# Patient Record
Sex: Female | Born: 1960 | Race: White | Hispanic: No | Marital: Single | Smoking: Current every day smoker
Health system: Southern US, Community
[De-identification: ages and names within clinical notes are randomized; demographics above are authoritative.]

## PROBLEM LIST (undated history)

## (undated) DIAGNOSIS — E039 Hypothyroidism, unspecified: Secondary | ICD-10-CM

## (undated) DIAGNOSIS — F4310 Post-traumatic stress disorder, unspecified: Secondary | ICD-10-CM

## (undated) DIAGNOSIS — C801 Malignant (primary) neoplasm, unspecified: Secondary | ICD-10-CM

## (undated) DIAGNOSIS — M5136 Other intervertebral disc degeneration, lumbar region: Secondary | ICD-10-CM

## (undated) DIAGNOSIS — J449 Chronic obstructive pulmonary disease, unspecified: Secondary | ICD-10-CM

## (undated) DIAGNOSIS — K2270 Barrett's esophagus without dysplasia: Secondary | ICD-10-CM

## (undated) DIAGNOSIS — M51369 Other intervertebral disc degeneration, lumbar region without mention of lumbar back pain or lower extremity pain: Secondary | ICD-10-CM

## (undated) DIAGNOSIS — M1990 Unspecified osteoarthritis, unspecified site: Secondary | ICD-10-CM

## (undated) DIAGNOSIS — G629 Polyneuropathy, unspecified: Secondary | ICD-10-CM

## (undated) DIAGNOSIS — M419 Scoliosis, unspecified: Secondary | ICD-10-CM

## (undated) DIAGNOSIS — S83519A Sprain of anterior cruciate ligament of unspecified knee, initial encounter: Secondary | ICD-10-CM

## (undated) DIAGNOSIS — K635 Polyp of colon: Secondary | ICD-10-CM

## (undated) DIAGNOSIS — B1920 Unspecified viral hepatitis C without hepatic coma: Secondary | ICD-10-CM

## (undated) DIAGNOSIS — R87619 Unspecified abnormal cytological findings in specimens from cervix uteri: Secondary | ICD-10-CM

## (undated) DIAGNOSIS — E079 Disorder of thyroid, unspecified: Secondary | ICD-10-CM

## (undated) DIAGNOSIS — K219 Gastro-esophageal reflux disease without esophagitis: Secondary | ICD-10-CM

## (undated) DIAGNOSIS — F419 Anxiety disorder, unspecified: Secondary | ICD-10-CM

## (undated) DIAGNOSIS — I10 Essential (primary) hypertension: Secondary | ICD-10-CM

## (undated) HISTORY — PX: CERVICAL BIOPSY  W/ LOOP ELECTRODE EXCISION: SUR135

## (undated) HISTORY — PX: TONSILLECTOMY: SUR1361

## (undated) HISTORY — DX: Polyp of colon: K63.5

## (undated) HISTORY — PX: CERVICAL CONE BIOPSY: SUR198

## (undated) HISTORY — DX: Barrett's esophagus without dysplasia: K22.70

## (undated) HISTORY — PX: CERVIX LESION DESTRUCTION: SHX591

## (undated) HISTORY — PX: TUBAL LIGATION: SHX77

## (undated) HISTORY — DX: Polyneuropathy, unspecified: G62.9

## (undated) HISTORY — PX: THYROIDECTOMY, PARTIAL: SHX18

## (undated) HISTORY — PX: NECK SURGERY: SHX720

## (undated) HISTORY — PX: DILATION AND CURETTAGE OF UTERUS: SHX78

## (undated) HISTORY — DX: Unspecified abnormal cytological findings in specimens from cervix uteri: R87.619

---

## 1986-06-25 DIAGNOSIS — C801 Malignant (primary) neoplasm, unspecified: Secondary | ICD-10-CM

## 1986-06-25 HISTORY — DX: Malignant (primary) neoplasm, unspecified: C80.1

## 2000-09-30 ENCOUNTER — Emergency Department (HOSPITAL_COMMUNITY): Admission: EM | Admit: 2000-09-30 | Discharge: 2000-10-01 | Payer: Self-pay | Admitting: Internal Medicine

## 2000-10-04 ENCOUNTER — Emergency Department (HOSPITAL_COMMUNITY): Admission: EM | Admit: 2000-10-04 | Discharge: 2000-10-04 | Payer: Self-pay | Admitting: Emergency Medicine

## 2000-10-06 ENCOUNTER — Emergency Department (HOSPITAL_COMMUNITY): Admission: EM | Admit: 2000-10-06 | Discharge: 2000-10-07 | Payer: Self-pay | Admitting: Emergency Medicine

## 2000-11-07 ENCOUNTER — Encounter: Payer: Self-pay | Admitting: Emergency Medicine

## 2000-11-07 ENCOUNTER — Emergency Department (HOSPITAL_COMMUNITY): Admission: EM | Admit: 2000-11-07 | Discharge: 2000-11-08 | Payer: Self-pay | Admitting: Emergency Medicine

## 2001-02-25 ENCOUNTER — Emergency Department (HOSPITAL_COMMUNITY): Admission: EM | Admit: 2001-02-25 | Discharge: 2001-02-25 | Payer: Self-pay

## 2001-02-28 ENCOUNTER — Emergency Department (HOSPITAL_COMMUNITY): Admission: EM | Admit: 2001-02-28 | Discharge: 2001-02-28 | Payer: Self-pay | Admitting: *Deleted

## 2001-04-04 ENCOUNTER — Encounter: Admission: RE | Admit: 2001-04-04 | Discharge: 2001-07-03 | Payer: Self-pay | Admitting: Anesthesiology

## 2004-07-07 ENCOUNTER — Emergency Department: Payer: Self-pay | Admitting: General Practice

## 2004-07-13 ENCOUNTER — Emergency Department: Payer: Self-pay | Admitting: Internal Medicine

## 2004-10-25 ENCOUNTER — Emergency Department: Payer: Self-pay | Admitting: Emergency Medicine

## 2005-07-04 ENCOUNTER — Emergency Department: Payer: Self-pay | Admitting: Emergency Medicine

## 2006-01-20 ENCOUNTER — Emergency Department: Payer: Self-pay | Admitting: Unknown Physician Specialty

## 2006-01-29 ENCOUNTER — Ambulatory Visit: Payer: Self-pay | Admitting: Otolaryngology

## 2007-04-24 ENCOUNTER — Emergency Department: Payer: Self-pay

## 2007-07-24 ENCOUNTER — Ambulatory Visit: Payer: Self-pay | Admitting: Family Medicine

## 2007-12-19 ENCOUNTER — Emergency Department: Payer: Self-pay | Admitting: Emergency Medicine

## 2008-06-19 ENCOUNTER — Emergency Department: Payer: Self-pay | Admitting: Emergency Medicine

## 2008-10-21 ENCOUNTER — Ambulatory Visit: Payer: Self-pay | Admitting: Family Medicine

## 2008-10-27 ENCOUNTER — Ambulatory Visit: Payer: Self-pay | Admitting: Family Medicine

## 2009-04-18 ENCOUNTER — Emergency Department: Payer: Self-pay | Admitting: Emergency Medicine

## 2009-05-22 ENCOUNTER — Emergency Department: Payer: Self-pay | Admitting: Internal Medicine

## 2010-03-13 ENCOUNTER — Emergency Department: Payer: Self-pay | Admitting: Emergency Medicine

## 2010-05-08 ENCOUNTER — Ambulatory Visit: Payer: Self-pay | Admitting: Family Medicine

## 2010-05-19 ENCOUNTER — Emergency Department: Payer: Self-pay | Admitting: Emergency Medicine

## 2010-05-20 ENCOUNTER — Emergency Department: Payer: Self-pay | Admitting: Unknown Physician Specialty

## 2010-05-21 ENCOUNTER — Inpatient Hospital Stay: Payer: Self-pay | Admitting: Specialist

## 2010-06-19 ENCOUNTER — Emergency Department: Payer: Self-pay | Admitting: Emergency Medicine

## 2010-10-02 ENCOUNTER — Emergency Department: Payer: Self-pay | Admitting: Emergency Medicine

## 2010-10-27 ENCOUNTER — Ambulatory Visit: Payer: Self-pay | Admitting: Family Medicine

## 2011-01-14 ENCOUNTER — Emergency Department: Payer: Self-pay | Admitting: Unknown Physician Specialty

## 2011-06-05 ENCOUNTER — Emergency Department: Payer: Self-pay | Admitting: *Deleted

## 2011-06-08 ENCOUNTER — Emergency Department: Payer: Self-pay | Admitting: Emergency Medicine

## 2012-04-19 ENCOUNTER — Emergency Department: Payer: Self-pay | Admitting: Emergency Medicine

## 2013-05-11 ENCOUNTER — Ambulatory Visit: Payer: Self-pay

## 2013-05-19 ENCOUNTER — Ambulatory Visit: Payer: Self-pay

## 2013-11-17 ENCOUNTER — Ambulatory Visit: Payer: Self-pay

## 2014-05-12 ENCOUNTER — Ambulatory Visit: Payer: Self-pay

## 2015-08-27 ENCOUNTER — Emergency Department: Payer: Self-pay

## 2015-08-27 ENCOUNTER — Emergency Department
Admission: EM | Admit: 2015-08-27 | Discharge: 2015-08-27 | Disposition: A | Discharge status: Home / Self Care | Payer: Self-pay | Attending: Student | Admitting: Student

## 2015-08-27 DIAGNOSIS — F17200 Nicotine dependence, unspecified, uncomplicated: Secondary | ICD-10-CM | POA: Insufficient documentation

## 2015-08-27 DIAGNOSIS — Z20818 Contact with and (suspected) exposure to other bacterial communicable diseases: Secondary | ICD-10-CM | POA: Insufficient documentation

## 2015-08-27 DIAGNOSIS — R1030 Lower abdominal pain, unspecified: Secondary | ICD-10-CM | POA: Insufficient documentation

## 2015-08-27 DIAGNOSIS — J069 Acute upper respiratory infection, unspecified: Secondary | ICD-10-CM | POA: Insufficient documentation

## 2015-08-27 DIAGNOSIS — R110 Nausea: Secondary | ICD-10-CM | POA: Insufficient documentation

## 2015-08-27 HISTORY — DX: Disorder of thyroid, unspecified: E07.9

## 2015-08-27 HISTORY — DX: Unspecified osteoarthritis, unspecified site: M19.90

## 2015-08-27 HISTORY — DX: Scoliosis, unspecified: M41.9

## 2015-08-27 LAB — URINALYSIS COMPLETE WITH MICROSCOPIC (ARMC ONLY)
BILIRUBIN URINE: NEGATIVE
Bacteria, UA: NONE SEEN
GLUCOSE, UA: NEGATIVE mg/dL
Ketones, ur: NEGATIVE mg/dL
Leukocytes, UA: NEGATIVE
Nitrite: NEGATIVE
Protein, ur: NEGATIVE mg/dL
SPECIFIC GRAVITY, URINE: 1.002 — AB (ref 1.005–1.030)
pH: 6 (ref 5.0–8.0)

## 2015-08-27 LAB — BASIC METABOLIC PANEL
Anion gap: 5 (ref 5–15)
BUN: 7 mg/dL (ref 6–20)
CALCIUM: 8.6 mg/dL — AB (ref 8.9–10.3)
CHLORIDE: 108 mmol/L (ref 101–111)
CO2: 27 mmol/L (ref 22–32)
CREATININE: 0.78 mg/dL (ref 0.44–1.00)
GFR calc Af Amer: >60 mL/min (ref >60)
GFR calc non Af Amer: >60 mL/min (ref >60)
GLUCOSE: 82 mg/dL (ref 65–99)
Potassium: 4.1 mmol/L (ref 3.5–5.1)
Sodium: 140 mmol/L (ref 135–145)

## 2015-08-27 LAB — CBC WITH DIFFERENTIAL/PLATELET
BASOS PCT: 1 %
Basophils Absolute: 0 10*3/uL (ref 0–0.1)
EOS ABS: 0.1 10*3/uL (ref 0–0.7)
EOS PCT: 2 %
HCT: 40.3 % (ref 35.0–47.0)
HEMOGLOBIN: 13.9 g/dL (ref 12.0–16.0)
LYMPHS ABS: 1.4 10*3/uL (ref 1.0–3.6)
Lymphocytes Relative: 34 %
MCH: 30.5 pg (ref 26.0–34.0)
MCHC: 34.4 g/dL (ref 32.0–36.0)
MCV: 88.7 fL (ref 80.0–100.0)
MONO ABS: 0.8 10*3/uL (ref 0.2–0.9)
MONOS PCT: 19 %
NEUTROS PCT: 44 %
Neutro Abs: 1.8 10*3/uL (ref 1.4–6.5)
Platelets: 209 10*3/uL (ref 150–440)
RBC: 4.54 MIL/uL (ref 3.80–5.20)
RDW: 13.7 % (ref 11.5–14.5)
WBC: 4.1 10*3/uL (ref 3.6–11.0)

## 2015-08-27 LAB — RAPID INFLUENZA A&B ANTIGENS (ARMC ONLY)
INFLUENZA A (ARMC): NEGATIVE
INFLUENZA B (ARMC): NEGATIVE

## 2015-08-27 LAB — POCT RAPID STREP A: Streptococcus, Group A Screen (Direct): NEGATIVE

## 2015-08-27 MED ORDER — ACETAMINOPHEN 325 MG PO TABS
ORAL_TABLET | ORAL | Status: AC
  Filled 2015-08-27: qty 2

## 2015-08-27 MED ORDER — AZITHROMYCIN 250 MG PO TABS: ORAL_TABLET | ORAL | Status: DC

## 2015-08-27 MED ORDER — IBUPROFEN 800 MG PO TABS: 800.0000 mg | ORAL_TABLET | Freq: Three times a day (TID) | ORAL | Status: DC | PRN

## 2015-08-27 MED ORDER — GUAIFENESIN-CODEINE 100-10 MG/5ML PO SOLN: 10.0000 mL | ORAL | Status: DC | PRN

## 2015-08-27 MED ORDER — ACETAMINOPHEN 500 MG PO TABS
1000.0000 mg | ORAL_TABLET | Freq: Once | ORAL | Status: AC
  Administered 2015-08-27: 1000 mg via ORAL
  Filled 2015-08-27: qty 2

## 2015-08-27 MED ORDER — SODIUM CHLORIDE 0.9 % IV BOLUS (SEPSIS)
1000.0000 mL | Freq: Once | INTRAVENOUS | Status: AC
  Administered 2015-08-27: 1000 mL via INTRAVENOUS

## 2015-08-27 MED ORDER — CHLORPHENIRAMINE MALEATE 4 MG PO TABS: 4.0000 mg | ORAL_TABLET | Freq: Two times a day (BID) | ORAL | Status: DC | PRN

## 2015-08-27 NOTE — ED Provider Notes (Signed)
[facility] Emergency Department Provider Note  ____________________________________________  Time seen: Approximately 12:05 PM  I have reviewed the triage vital signs and the nursing notes.   HISTORY  Chief Complaint Generalized Body Aches and Cough    HPI Brittney Chavez is a 56 y.o. female presents with a 2 to three-day history of fever chills body aches cough and abdominal pain nausea and dysuria dehydrated. Patient states that her appetite and desire to drink is been decreased. Feels thirsty. Able to keep fluids down without vomiting.Marland Kitchen Positive for headache nonradiating.Her daughter-in-law's in the next room with positive strep pharyngitis.   Past Medical History  Diagnosis Date  . Thyroid disease   . Scoliosis   . Arthritis     There are no active problems to display for this patient.   No past surgical history on file.  Current Outpatient Rx  Name  Route  Sig  Dispense  Refill  . azithromycin (ZITHROMAX Z-PAK) 250 MG tablet      Take 2 tablets (500 mg) on  Day 1,  followed by 1 tablet (250 mg) once daily on Days 2 through 5.   6 each   0   . chlorpheniramine (CHLOR-TRIMETON) 4 MG tablet   Oral   Take 1 tablet (4 mg total) by mouth 2 (two) times daily as needed for allergies or rhinitis.   30 tablet   0   . guaiFENesin-codeine 100-10 MG/5ML syrup   Oral   Take 10 mLs by mouth every 4 (four) hours as needed for cough.   180 mL   0   . ibuprofen (ADVIL,MOTRIN) 800 MG tablet   Oral   Take 1 tablet (800 mg total) by mouth every 8 (eight) hours as needed.   30 tablet   0     Allergies Review of patient's allergies indicates no known allergies.  No family history on file.  Social History Social History  Substance Use Topics  . Smoking status: Current Every Day Smoker  . Smokeless tobacco: None  . Alcohol Use: No    Review of Systems Constitutional: As a fever/chills Eyes: No visual changes. ENT: Positive sore  throat. Cardiovascular: Denies chest pain. Respiratory: Denies shortness of breath. Positive for cough Gastrointestinal: Positive lower abdominal pain.  Positive nausea, no vomiting.  No diarrhea.  No constipation. Genitourinary: Positive for dysuria with associated foul-smelling urine. Musculoskeletal: Positive for generalized body aches. Skin: Negative for rash. Neurological: Positive for headaches, negative for focal weakness or numbness.  10-point ROS otherwise negative.  ____________________________________________   PHYSICAL EXAM:  VITAL SIGNS: ED Triage Vitals  Enc Vitals Group     BP 08/27/15 1138 132/91 mmHg     Pulse Rate 08/27/15 1138 87     Resp 08/27/15 1138 18     Temp 08/27/15 1138 97.9 F (36.6 C)     Temp Source 08/27/15 1138 Oral     SpO2 08/27/15 1138 95 %     Weight 08/27/15 1138 195 lb (88.451 kg)     Height 08/27/15 1138 5\' 5"  (1.651 m)     Head Cir --      Peak Flow --      Pain Score 08/27/15 1138 9     Pain Loc --      Pain Edu? --      Excl. in [place]? --     Constitutional: Alert and oriented. Well appearing and in no acute distress. Eyes: Conjunctivae are normal. PERRL. EOMI. Head: Atraumatic. Nose:  Positive nasal congestion/rhinnorhea. Mouth/Throat: Mucous membranes are moist.  Oropharynx mildly erythematous. Neck: No stridor.  Full range of motion nontender Cardiovascular: Normal rate, regular rhythm. Grossly normal heart sounds.  Good peripheral circulation. Respiratory: Normal respiratory effort.  No retractions. Lungs with poor inspiration and decreased breath sounds bilaterally. Gastrointestinal: Soft and nontender. No distention. No abdominal bruits. No CVA tenderness. Musculoskeletal: No lower extremity tenderness nor edema.  No joint effusions. Neurologic:  Normal speech and language. No gross focal neurologic deficits are appreciated. No gait instability. Skin:  Skin is warm, dry and intact. No rash noted. Psychiatric: Mood and affect  are normal. Speech and behavior are normal.  ____________________________________________   LABS (all labs ordered are listed, but only abnormal results are displayed)  Labs Reviewed  BASIC METABOLIC PANEL - Abnormal; Notable for the following:    Calcium 8.6 (*)    All other components within normal limits  URINALYSIS COMPLETEWITH MICROSCOPIC ([facility] ONLY) - Abnormal; Notable for the following:    Color, Urine STRAW (*)    APPearance CLEAR (*)    Specific Gravity, Urine 1.002 (*)    Hgb urine dipstick 1+ (*)    Squamous Epithelial / LPF 0-5 (*)    All other components within normal limits  RAPID INFLUENZA A&B ANTIGENS ([facility] ONLY)  CBC WITH DIFFERENTIAL/PLATELET  POCT RAPID STREP A   ____________________________________________  RADIOLOGY  Negative for any acute cardiopulmonary disorders. ____________________________________________   PROCEDURES  Procedure(s) performed: None  Critical Care performed: No  ____________________________________________   INITIAL IMPRESSION / ASSESSMENT AND PLAN / ED COURSE  Pertinent labs & imaging results that were available during my care of the patient were reviewed by me and considered in my medical decision making (see chart for details).  Acute pharyngitis with strep exposure. Acute upper respiratory infection. Rx given for Zithromax, chlorpheniramine, Robitussin-AC, Motrin 800 mg. Patient follow-up with PCP or return to the ER with any worsening symptomology. He discontinued catheter tach patient feels better than she did upon arrival. ____________________________________________   FINAL CLINICAL IMPRESSION(S) / ED DIAGNOSES  Final diagnoses:  URI, acute  Strep throat exposure     This chart was dictated using voice recognition software/Dragon. Despite best efforts to proofread, errors can occur which can change the meaning. Any change was purely unintentional.   Arlyss Repress, PA-C 08/27/15 1405  Joanne Gavel,  MD 08/27/15 1606

## 2015-08-27 NOTE — ED Notes (Signed)
Discussed discharge instructions, prescriptions, and follow-up care with patient. No questions or concerns at this time. Pt stable at discharge.  

## 2015-08-27 NOTE — ED Notes (Signed)
Pt arrives to ER via POV c/o generalized body aches, cough, strong smelling urine. Pt alert and oriented X4, active, cooperative, pt in NAD. RR even and unlabored, color WNL.

## 2015-08-27 NOTE — Discharge Instructions (Signed)
Upper Respiratory Infection, Adult Most upper respiratory infections (URIs) are a viral infection of the air passages leading to the lungs. A URI affects the nose, throat, and upper air passages. The most common type of URI is nasopharyngitis and is typically referred to as "the common cold." URIs run their course and usually go away on their own. Most of the time, a URI does not require medical attention, but sometimes a bacterial infection in the upper airways can follow a viral infection. This is called a secondary infection. Sinus and middle ear infections are common types of secondary upper respiratory infections. Bacterial pneumonia can also complicate a URI. A URI can worsen asthma and chronic obstructive pulmonary disease (COPD). Sometimes, these complications can require emergency medical care and may be life threatening.  CAUSES Almost all URIs are caused by viruses. A virus is a type of germ and can spread from one person to another.  RISKS FACTORS You may be at risk for a URI if:   You smoke.   You have chronic heart or lung disease.  You have a weakened defense (immune) system.   You are very young or very old.   You have nasal allergies or asthma.  You work in crowded or poorly ventilated areas.  You work in health care facilities or schools. SIGNS AND SYMPTOMS  Symptoms typically develop 2-3 days after you come in contact with a cold virus. Most viral URIs last 7-10 days. However, viral URIs from the influenza virus (flu virus) can last 14-18 days and are typically more severe. Symptoms may include:   Runny or stuffy (congested) nose.   Sneezing.   Cough.   Sore throat.   Headache.   Fatigue.   Fever.   Loss of appetite.   Pain in your forehead, behind your eyes, and over your cheekbones (sinus pain).  Muscle aches.  DIAGNOSIS  Your health care provider may diagnose a URI by:  Physical exam.  Tests to check that your symptoms are not due to  another condition such as:  Strep throat.  Sinusitis.  Pneumonia.  Asthma. TREATMENT  A URI goes away on its own with time. It cannot be cured with medicines, but medicines may be prescribed or recommended to relieve symptoms. Medicines may help:  Reduce your fever.  Reduce your cough.  Relieve nasal congestion. HOME CARE INSTRUCTIONS   Take medicines only as directed by your health care provider.   Gargle warm saltwater or take cough drops to comfort your throat as directed by your health care provider.  Use a warm mist humidifier or inhale steam from a shower to increase air moisture. This may make it easier to breathe.  Drink enough fluid to keep your urine clear or pale yellow.   Eat soups and other clear broths and maintain good nutrition.   Rest as needed.   Return to work when your temperature has returned to normal or as your health care provider advises. You may need to stay home longer to avoid infecting others. You can also use a face mask and careful hand washing to prevent spread of the virus.  Increase the usage of your inhaler if you have asthma.   Do not use any tobacco products, including cigarettes, chewing tobacco, or electronic cigarettes. If you need help quitting, ask your health care provider. PREVENTION  The best way to protect yourself from getting a cold is to practice good hygiene.   Avoid oral or hand contact with people with cold   symptoms.   Wash your hands often if contact occurs.  There is no clear evidence that vitamin C, vitamin E, echinacea, or exercise reduces the chance of developing a cold. However, it is always recommended to get plenty of rest, exercise, and practice good nutrition.  SEEK MEDICAL CARE IF:   You are getting worse rather than better.   Your symptoms are not controlled by medicine.   You have chills.  You have worsening shortness of breath.  You have brown or red mucus.  You have yellow or brown nasal  discharge.  You have pain in your face, especially when you bend forward.  You have a fever.  You have swollen neck glands.  You have pain while swallowing.  You have white areas in the back of your throat. SEEK IMMEDIATE MEDICAL CARE IF:   You have severe or persistent:  Headache.  Ear pain.  Sinus pain.  Chest pain.  You have chronic lung disease and any of the following:  Wheezing.  Prolonged cough.  Coughing up blood.  A change in your usual mucus.  You have a stiff neck.  You have changes in your:  Vision.  Hearing.  Thinking.  Mood. MAKE SURE YOU:   Understand these instructions.  Will watch your condition.  Will get help right away if you are not doing well or get worse.   This information is not intended to replace advice given to you by your health care provider. Make sure you discuss any questions you have with your health care provider.   Document Released: 12/05/2000 Document Revised: 10/26/2014 Document Reviewed: 09/16/2013 Elsevier Interactive Patient Education 2016 Elsevier Inc[employer]  

## 2015-12-10 ENCOUNTER — Encounter: Payer: Self-pay | Admitting: *Deleted

## 2015-12-10 ENCOUNTER — Emergency Department: Payer: Self-pay

## 2015-12-10 ENCOUNTER — Emergency Department
Admission: EM | Admit: 2015-12-10 | Discharge: 2015-12-11 | Disposition: A | Discharge status: Home / Self Care | Payer: Self-pay | Attending: Emergency Medicine | Admitting: Emergency Medicine

## 2015-12-10 DIAGNOSIS — Z79899 Other long term (current) drug therapy: Secondary | ICD-10-CM | POA: Insufficient documentation

## 2015-12-10 DIAGNOSIS — M1990 Unspecified osteoarthritis, unspecified site: Secondary | ICD-10-CM | POA: Insufficient documentation

## 2015-12-10 DIAGNOSIS — Y939 Activity, unspecified: Secondary | ICD-10-CM | POA: Insufficient documentation

## 2015-12-10 DIAGNOSIS — Y999 Unspecified external cause status: Secondary | ICD-10-CM | POA: Insufficient documentation

## 2015-12-10 DIAGNOSIS — F17200 Nicotine dependence, unspecified, uncomplicated: Secondary | ICD-10-CM | POA: Insufficient documentation

## 2015-12-10 DIAGNOSIS — S86912A Strain of unspecified muscle(s) and tendon(s) at lower leg level, left leg, initial encounter: Secondary | ICD-10-CM

## 2015-12-10 DIAGNOSIS — S86812A Strain of other muscle(s) and tendon(s) at lower leg level, left leg, initial encounter: Secondary | ICD-10-CM | POA: Insufficient documentation

## 2015-12-10 DIAGNOSIS — F1290 Cannabis use, unspecified, uncomplicated: Secondary | ICD-10-CM | POA: Insufficient documentation

## 2015-12-10 DIAGNOSIS — Y92009 Unspecified place in unspecified non-institutional (private) residence as the place of occurrence of the external cause: Secondary | ICD-10-CM | POA: Insufficient documentation

## 2015-12-10 DIAGNOSIS — Z792 Long term (current) use of antibiotics: Secondary | ICD-10-CM | POA: Insufficient documentation

## 2015-12-10 HISTORY — DX: Unspecified viral hepatitis C without hepatic coma: B19.20

## 2015-12-10 MED ORDER — HYDROCODONE-ACETAMINOPHEN 5-325 MG PO TABS: 1.0000 | ORAL_TABLET | ORAL | Status: DC | PRN

## 2015-12-10 MED ORDER — OXYCODONE-ACETAMINOPHEN 5-325 MG PO TABS
1.0000 | ORAL_TABLET | ORAL | Status: DC | PRN
  Administered 2015-12-10: 1 via ORAL

## 2015-12-10 MED ORDER — OXYCODONE-ACETAMINOPHEN 5-325 MG PO TABS
ORAL_TABLET | ORAL | Status: AC
  Administered 2015-12-10: 1 via ORAL
  Filled 2015-12-10: qty 1

## 2015-12-10 MED ORDER — CYCLOBENZAPRINE HCL 5 MG PO TABS: 5.0000 mg | ORAL_TABLET | Freq: Three times a day (TID) | ORAL | Status: DC | PRN
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
## 2015-12-10 NOTE — Discharge Instructions (Signed)
Elastic Bandage and RICE WHAT DOES AN ELASTIC BANDAGE DO? Elastic bandages come in different shapes and sizes. They generally provide support to your injury and reduce swelling while you are healing, but they can perform different functions. Your health care provider will help you to decide what is best for your protection, recovery, or rehabilitation following an injury. WHAT ARE SOME GENERAL TIPS FOR USING AN ELASTIC BANDAGE?  Use the bandage as directed by the maker of the bandage that you are using.  Do not wrap the bandage too tightly. This may cut off the circulation in the arm or leg in the area below the bandage.  If part of your body beyond the bandage becomes blue, numb, cold, swollen, or is more painful, your bandage is most likely too tight. If this occurs, remove your bandage and reapply it more loosely.  See your health care provider if the bandage seems to be making your problems worse rather than better.  An elastic bandage should be removed and reapplied every 3-4 hours or as directed by your health care provider. WHAT IS RICE? The routine care of many injuries includes rest, ice, compression, and elevation (RICE therapy).  Rest Rest is required to allow your body to heal. Generally, you can resume your routine activities when you are comfortable and have been given permission by your health care provider. Ice Icing your injury helps to keep the swelling down and it reduces pain. Do not apply ice directly to your skin.  Put ice in a plastic bag.  Place a towel between your skin and the bag.  Leave the ice on for 20 minutes, 2-3 times per day. Do this for as long as you are directed by your health care provider. Compression Compression helps to keep swelling down, gives support, and helps with discomfort. Compression may be done with an elastic bandage. Elevation Elevation helps to reduce swelling and it decreases pain. If possible, your injured area should be placed at  or above the level of your heart or the center of your chest. [place]? You should seek medical care if:  You have persistent pain and swelling.  Your symptoms are getting worse rather than improving. These symptoms may indicate that further evaluation or further X-rays are needed. Sometimes, X-rays may not show a small broken bone (fracture) until a number of days later. Make a follow-up appointment with your health care provider. Ask when your X-ray results will be ready. Make sure that you get your X-ray results. WHEN SHOULD I SEEK IMMEDIATE MEDICAL CARE? You should seek immediate medical care if:  You have a sudden onset of severe pain at or below the area of your injury.  You develop redness or increased swelling around your injury.  You have tingling or numbness at or below the area of your injury that does not improve after you remove the elastic bandage.   This information is not intended to replace advice given to you by your health care provider. Make sure you discuss any questions you have with your health care provider.   Document Released: 12/01/2001 Document Revised: 03/02/2015 Document Reviewed: 01/25/2014 Elsevier Interactive Patient Education 2016 [place] Assault Assault includes any behavior or physical attack--whether it is on purpose or not--that results in injury to another person, damage to property, or both. This also includes assault that has not yet happened, but is planned to happen. Threats of assault may be physical, verbal, or written. They may  be said or sent by:  Mail.  E-mail.  Text.  Social media.  Fax. The threats may be direct, implied, or understood. WHAT ARE THE DIFFERENT FORMS OF ASSAULT? Forms of assault include:  Physically assaulting a person. This includes physical threats to inflict physical harm as well as:  Slapping.  Hitting.  Poking.  Kicking.  Punching.  Pushing.  Sexually  assaulting a person. Sexual assault is any sexual activity that a person is forced, threatened, or coerced to participate in. It may or may not involve physical contact with the person who is assaulting you. You are sexually assaulted if you are forced to have sexual contact of any kind.  Damaging or destroying a person's assistive equipment, such as glasses, canes, or walkers.  Throwing or hitting objects.  Using or displaying a weapon to harm or threaten someone.  Using or displaying an object that appears to be a weapon in a threatening manner.  Using greater physical size or strength to intimidate someone.  Making intimidating or threatening gestures.  Bullying.  Hazing.  Using language that is intimidating, threatening, hostile, or abusive.  Stalking.  Restraining someone with force. WHAT SHOULD I DO IF I EXPERIENCE ASSAULT?  Report assaults, threats, and stalking to the police. Call your local emergency services (911 in the [place] if you are in immediate danger or you need medical help.  You can work with a Chief Executive Officer or an advocate to get legal protection against someone who has assaulted you or threatened you with assault. Protection includes restraining orders and private addresses. Crimes against you, such as assault, can also be prosecuted through the courts. Laws will vary depending on where you live.   This information is not intended to replace advice given to you by your health care provider. Make sure you discuss any questions you have with your health care provider.   Document Released: 06/11/2005 Document Revised: 07/02/2014 Document Reviewed: 02/26/2014 Elsevier Interactive Patient Education 2016 [place] the ace wrap as needed for support. Take the prescription meds as directed. Take OTC ibuprofen or naproxen for pain and inflammation. Follow-up with [facility] as needed.
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
## 2015-12-11 NOTE — ED Notes (Signed)

## 2016-01-22 ENCOUNTER — Encounter: Payer: Self-pay | Admitting: Emergency Medicine

## 2016-01-22 ENCOUNTER — Emergency Department
Admission: EM | Admit: 2016-01-22 | Discharge: 2016-01-22 | Disposition: A | Discharge status: Home / Self Care | Payer: Self-pay | Attending: Emergency Medicine | Admitting: Emergency Medicine

## 2016-01-22 ENCOUNTER — Emergency Department: Payer: Self-pay

## 2016-01-22 DIAGNOSIS — Y999 Unspecified external cause status: Secondary | ICD-10-CM | POA: Insufficient documentation

## 2016-01-22 DIAGNOSIS — Y939 Activity, unspecified: Secondary | ICD-10-CM | POA: Insufficient documentation

## 2016-01-22 DIAGNOSIS — F17200 Nicotine dependence, unspecified, uncomplicated: Secondary | ICD-10-CM | POA: Insufficient documentation

## 2016-01-22 DIAGNOSIS — Y929 Unspecified place or not applicable: Secondary | ICD-10-CM | POA: Insufficient documentation

## 2016-01-22 DIAGNOSIS — F1290 Cannabis use, unspecified, uncomplicated: Secondary | ICD-10-CM | POA: Insufficient documentation

## 2016-01-22 DIAGNOSIS — M1990 Unspecified osteoarthritis, unspecified site: Secondary | ICD-10-CM | POA: Insufficient documentation

## 2016-01-22 DIAGNOSIS — S8392XA Sprain of unspecified site of left knee, initial encounter: Secondary | ICD-10-CM | POA: Insufficient documentation

## 2016-01-22 MED ORDER — ETODOLAC 400 MG PO TABS: 400.0000 mg | ORAL_TABLET | Freq: Two times a day (BID) | ORAL | 0 refills | Status: AC

## 2016-01-22 MED ORDER — KETOROLAC TROMETHAMINE 30 MG/ML IJ SOLN
30.0000 mg | Freq: Once | INTRAMUSCULAR | Status: AC
  Administered 2016-01-22: 30 mg via INTRAMUSCULAR
  Filled 2016-01-22: qty 1

## 2016-01-22 NOTE — ED Provider Notes (Signed)
[facility] Emergency Department Provider Note  ____________________________________________  Time seen: Approximately 2:08 PM  I have reviewed the triage vital signs and the nursing notes.   HISTORY  Chief Complaint Knee Pain    HPI Brittney Chavez is a 55 y.o. female , NAD, presents to emergency department with continued left knee pain. Patient states she has had left knee pain that has been worsening over the last 3 weeks. Describes the pain as sharp and intermittent.Was initially assessed in this emergency department and diagnosed with a left knee sprain. Patient attempted to schedule an appointment with her primary care provider but states they did not have anything available for the next 2-3 weeks. Patient notes that the pain in the left knee keeps her up at night. Has been taking 800 mg ibuprofen 3-4 times daily without significant relief of her pain. Denies any numbness, weakness, tingling. Has had no back pain. Does note that the left knee can "give out" on occasion while she is walking. Has had no further injuries, traumas, falls to the knee since her last evaluation.has not noted any redness, swelling, warmth to the knee. No open wounds or lesions.    Past Medical History:  Diagnosis Date  . Arthritis   . Hepatitis C   . Scoliosis   . Thyroid disease     There are no active problems to display for this patient.   History reviewed. No pertinent surgical history.  Prior to Admission medications   Medication Sig Start Date End Date Taking? Authorizing Provider  etodolac (LODINE) 400 MG tablet Take 1 tablet (400 mg total) by mouth 2 (two) times daily. 01/22/16 02/05/16  Carney Saxton L Khoa Opdahl, PA-C  ibuprofen (ADVIL,MOTRIN) 800 MG tablet Take 1 tablet (800 mg total) by mouth every 8 (eight) hours as needed. 08/27/15   Arlyss Repress, PA-C    Allergies Review of patient's allergies indicates no known allergies.  No family history on file.  Social  History Social History  Substance Use Topics  . Smoking status: Current Every Day Smoker  . Smokeless tobacco: Never Used  . Alcohol use No     Review of Systems  Constitutional: No fatigue Cardiovascular: No chest pain. Respiratory:  No shortness of breath.  Musculoskeletal: Positive left knee pain.Negative for back pain.  Skin: Negative for rash, redness, swelling, skin sores. Neurological: Negative for headaches, focal weakness or numbness.no tingling 10-point ROS otherwise negative.  ____________________________________________   PHYSICAL EXAM:  VITAL SIGNS: ED Triage Vitals  Enc Vitals Group     BP 01/22/16 1302 (!) 180/85     Pulse Rate 01/22/16 1302 66     Resp 01/22/16 1302 20     Temp 01/22/16 1302 97.5 F (36.4 C)     Temp Source 01/22/16 1302 Oral     SpO2 01/22/16 1302 99 %     Weight 01/22/16 1302 185 lb (83.9 kg)     Height 01/22/16 1302 5\' 4"  (1.626 m)     Head Circumference --      Peak Flow --      Pain Score 01/22/16 1257 6     Pain Loc --      Pain Edu? --      Excl. in [place]? --      Constitutional: Alert and oriented. Well appearing and in no acute distress. Eyes: Conjunctivae are normal.  Head: Atraumatic. Cardiovascular:   Good peripheral circulation. Respiratory: Normal respiratory effort without tachypnea or retractions.  Musculoskeletal: No laxity  with anterior or posterior drawer of the left knee. No laxity with varus or valgus stress of left knee.  Negative patellofemoral grind. Decreased range of motion of left knee with extension due to pain at approximately 130. No tenderness to palpation of the lateral or medial ligamentous lines.No lower extremity tenderness nor edema.  No joint effusions. Neurologic:  Normal speech and language. No gross focal neurologic deficits are appreciated.  Skin:  Skin is warm, dry and intact. No rash, redness, swelling, skin sores noted. Psychiatric: Mood and affect are normal. Speech and behavior are normal.  Patient exhibits appropriate insight and judgement.   ____________________________________________   LABS  None ____________________________________________  EKG  None ____________________________________________  RADIOLOGY I have personally viewed and evaluated these images (plain radiographs) as part of my medical decision making, as well as reviewing the written report by the radiologist.  Dg Knee Complete 4 Views Left  Result Date: 01/22/2016 CLINICAL DATA:  Trauma to lateral knee 13 days ago. Persistent pain with weight-bearing and walking. EXAM: LEFT KNEE - COMPLETE 4+ VIEW COMPARISON:  12/10/2015 FINDINGS: No evidence of fracture, dislocation, or joint effusion. No evidence of arthropathy or other focal bone abnormality. Soft tissues are unremarkable. IMPRESSION: Negative left knee radiographs. Electronically Signed   By: San Morelle M.D.   On: 01/22/2016 13:32   ____________________________________________    PROCEDURES  Procedure(s) performed: None   Procedures   Medications  ketorolac (TORADOL) 30 MG/ML injection 30 mg (30 mg Intramuscular Given 01/22/16 1440)     ____________________________________________   INITIAL IMPRESSION / ASSESSMENT AND PLAN / ED COURSE  Pertinent labs & imaging results that were available during my care of the patient were reviewed by me and considered in my medical decision making (see chart for details).  Clinical Course    Patient's diagnosis is consistent with left knee sprain. Patient will be discharged home with prescriptions for Lodine to take as needed for pain. Patient is to follow up with Dr. Marry Guan in orthopedics in 2-3 days for further evaluation and treatment of continued left knee pain and to rule out potential meniscal injury. Patient is given ED precautions to return to the ED for any worsening or new symptoms.      ____________________________________________  FINAL CLINICAL IMPRESSION(S) / ED  DIAGNOSES  Final diagnoses:  Left knee sprain, initial encounter      NEW MEDICATIONS STARTED DURING THIS VISIT:  New Prescriptions   ETODOLAC (LODINE) 400 MG TABLET    Take 1 tablet (400 mg total) by mouth 2 (two) times daily.         Braxton Feathers, PA-C 01/22/16 1444    Harvest Dark, MD 01/22/16 1510
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
## 2016-01-22 NOTE — ED Notes (Signed)
She was seen about 4-5 weeks ago for knee pain  States pain is worse   Feels like her knee is "clicking"  Min swelling noted

## 2016-02-23 LAB — CBC WITH DIFFERENTIAL
ALK PHOS: 77 U/L
ALT: 31
AST: 23 U/L
BUN: 11
CHOLESTEROL, TOTAL: 213
CREATININE: 0.8
Glucose: 83
HCT: 44 %
HDL Cholesterol: 40
HEMOGLOBIN: 14.9
Hemoglobin A1C: 5.4
LDL Cholesterol (Calc): 138
LDL/HDL Ratio: 3.5
NEUTROS PCT: 61
Neutrophils absolute (GR#): 5.1 10*3/uL (ref 1.7–7.8)
PLATELETS: 312
Potassium: 4.4 mmol/L
RBC: 4.67
Sodium: 140
TRIGLYCERIDES: 173
TSH: 2.55
Total Bilirubin: 0.3 mg/dL
VLDL CHOLESTEROL, CALC: 35
WBC: 8.3

## 2016-02-29 LAB — FECAL OCCULT BLOOD, GUAIAC: Fecal Occult Blood: NEGATIVE

## 2016-03-02 ENCOUNTER — Encounter (INDEPENDENT_AMBULATORY_CARE_PROVIDER_SITE_OTHER): Payer: Self-pay

## 2016-03-02 ENCOUNTER — Ambulatory Visit: Payer: Self-pay

## 2016-03-07 ENCOUNTER — Telehealth: Payer: Self-pay

## 2016-03-07 ENCOUNTER — Ambulatory Visit
Admission: RE | Admit: 2016-03-07 | Discharge: 2016-03-07 | Disposition: A | Discharge status: Home / Self Care | Payer: Self-pay | Source: Ambulatory Visit | Attending: Oncology | Admitting: Oncology

## 2016-03-07 ENCOUNTER — Ambulatory Visit: Payer: Self-pay | Attending: Oncology

## 2016-03-07 VITALS — BP 123/82 | HR 86 | Temp 97.6°F | Ht 66.14 in | Wt 193.6 lb

## 2016-03-07 DIAGNOSIS — Z0000 Encounter for general adult medical examination without abnormal findings: Secondary | ICD-10-CM

## 2016-03-07 NOTE — Progress Notes (Signed)
Subjective:     Patient ID: Brittney Chavez, female   DOB: 28-Jan-1961, 55 y.o.   MRN: [id number]  HPI   Review of Systems     Objective:   Physical Exam  Pulmonary/Chest: Right breast exhibits no inverted nipple, no mass, no nipple discharge, no skin change and no tenderness. Left breast exhibits no inverted nipple, no mass, no nipple discharge, no skin change and no tenderness. Breasts are symmetrical.  Genitourinary:    No labial fusion. There is lesion on the right labia. There is no rash, tenderness or injury on the right labia. There is no rash, tenderness, lesion or injury on the left labia. Uterus is not deviated, not enlarged, not fixed and not tender. Cervix exhibits no motion tenderness, no discharge and no friability. Right adnexum displays no mass, no tenderness and no fullness. Left adnexum displays no mass, no tenderness and no fullness. No erythema, tenderness or bleeding in the vagina. No foreign body in the vagina. No signs of injury around the vagina. No vaginal discharge found.       Assessment:     55 year old patient presents for [facility] clinic visit.  Patient screened, and meets BCCCP eligibility.  Patient does not have insurance, Medicare or Medicaid.  Handout given on Affordable Care Act. Instructed patient on breast self-exam using teach back method.  CBE unremarkable.  Patient has strong family history of cancer.  Mother had breast cancer.   On pelvic exam noted a raised white 79mm lesion on inner right labia.  Specimen collected for pap.    Plan:     Will refer to [facility] when pap results available.  Sent for bilateral screening mammogram.

## 2016-03-07 NOTE — Telephone Encounter (Signed)
Received all paper work for eligibility. Called and made pt appt. Pt verbalized understanding.

## 2016-03-12 LAB — PAP LB AND HPV HIGH-RISK
HPV, HIGH-RISK: POSITIVE — AB
PAP Smear Comment: 0

## 2016-03-13 NOTE — Progress Notes (Signed)
Reviewed [facility] 1 mammogram results, and ASCUS/HPV positive pap results with patient.  Scheduled appointment with Dr. Enzo Bi for abnormal pap on 04/17/16 at 1:45 p.m.. Explained to patient, and to [place] at [facility] that [employer] will pay for cervical/pap follow-up.  If area noted on inner right labia needs follow-up, patient will be responsible, and may be able to fill out Financial Assistance paperwork.

## 2016-03-21 ENCOUNTER — Encounter: Payer: Self-pay | Admitting: Internal Medicine

## 2016-03-21 ENCOUNTER — Ambulatory Visit: Payer: Self-pay | Admitting: Internal Medicine

## 2016-03-21 VITALS — BP 128/92 | HR 84 | Temp 98.1°F | Wt 195.0 lb

## 2016-03-21 DIAGNOSIS — Z8639 Personal history of other endocrine, nutritional and metabolic disease: Secondary | ICD-10-CM | POA: Insufficient documentation

## 2016-03-21 DIAGNOSIS — R1310 Dysphagia, unspecified: Secondary | ICD-10-CM | POA: Insufficient documentation

## 2016-03-21 DIAGNOSIS — Z833 Family history of diabetes mellitus: Secondary | ICD-10-CM

## 2016-03-21 DIAGNOSIS — B1920 Unspecified viral hepatitis C without hepatic coma: Secondary | ICD-10-CM

## 2016-03-21 DIAGNOSIS — M419 Scoliosis, unspecified: Secondary | ICD-10-CM

## 2016-03-21 LAB — GLUCOSE, POCT (MANUAL RESULT ENTRY): POC GLUCOSE: 86 mg/dL (ref 70–99)

## 2016-03-21 NOTE — Patient Instructions (Signed)
Referral for PA Lateral chest xray and Barium Swallow xray for difficult swallowing, smoker, and choking  Labs today: Hep C, Hep B immune status  F/u in 2 weeks

## 2016-03-21 NOTE — Progress Notes (Signed)
   Subjective:    Patient ID: Brittney Chavez, female    DOB: August 25, 1960, 55 y.o.   MRN: [id number]  HPI   New pt Pt presents for dysphagia and back pain.  Pt has scoliosis and degenerative disk. Pt has underactive thyroid.  Pt strained her Acl and Meniscus in Aug.  Previous Dr. prescribed 2 Gabapentin BID. Pt reports taking it for 3 yrs for scoliosis and to maintain mobility. Pt reports trouble swallowing food for 2 yrs. Reports food gets lodged in throat and chest.  Pt reports taking 6 Ibuprofen or Tylenol per day for lower back and hip pain.  History of Hep C 20 yrs ago. History of Hep A  Pt reports smoking  There are no active problems to display for this patient.    Medication List       Accurate as of 03/21/16 10:47 AM. Always use your most recent med list.          albuterol 108 (90 Base) MCG/ACT inhaler Commonly known as:  PROVENTIL HFA;VENTOLIN HFA Inhale into the lungs every 6 (six) hours as needed for wheezing or shortness of breath.   CLARITIN PO Take by mouth.   gabapentin 600 MG tablet Commonly known as:  NEURONTIN Take 600 mg by mouth.   ibuprofen 800 MG tablet Commonly known as:  ADVIL,MOTRIN Take 1 tablet (800 mg total) by mouth every 8 (eight) hours as needed.   levothyroxine 100 MCG tablet Commonly known as:  SYNTHROID, LEVOTHROID Take by mouth.   omeprazole 40 MG capsule Commonly known as:  PRILOSEC Take 40 mg by mouth daily.   VALTREX PO Take by mouth.        Review of Systems      Objective:   Physical Exam  Constitutional: She is oriented to person, place, and time.  Cardiovascular: Normal rate, regular rhythm and normal heart sounds.   Pulmonary/Chest: Effort normal and breath sounds normal.  Neurological: She is alert and oriented to person, place, and time.    BP (!) 128/92   Pulse 84   Temp 98.1 F (36.7 C) (Oral)   Wt 195 lb (88.5 kg)   LMP 03/07/2012 (Approximate)   BMI 31.34 kg/m        Assessment & Plan:    Referral for PA Lateral chest xray and Barium Swallow xray for difficult swallowing, smoker, and choking  Labs today: Hep C, Hep B immune status  F/u in 2 weeks

## 2016-03-22 ENCOUNTER — Ambulatory Visit
Admission: RE | Admit: 2016-03-22 | Discharge: 2016-03-22 | Disposition: A | Discharge status: Home / Self Care | Payer: Self-pay | Source: Ambulatory Visit | Attending: Specialist | Admitting: Specialist

## 2016-03-22 ENCOUNTER — Ambulatory Visit
Admission: RE | Admit: 2016-03-22 | Discharge: 2016-03-22 | Disposition: A | Discharge status: Home / Self Care | Payer: Self-pay | Source: Ambulatory Visit | Attending: Unknown Physician Specialty | Admitting: Unknown Physician Specialty

## 2016-03-22 ENCOUNTER — Ambulatory Visit
Admission: RE | Admit: 2016-03-22 | Discharge: 2016-03-22 | Disposition: A | Discharge status: Home / Self Care | Payer: Self-pay | Source: Ambulatory Visit | Attending: Internal Medicine | Admitting: Internal Medicine

## 2016-03-22 ENCOUNTER — Other Ambulatory Visit: Payer: Self-pay | Admitting: Specialist

## 2016-03-22 DIAGNOSIS — Z720 Tobacco use: Secondary | ICD-10-CM | POA: Insufficient documentation

## 2016-03-22 DIAGNOSIS — R1310 Dysphagia, unspecified: Secondary | ICD-10-CM

## 2016-03-22 DIAGNOSIS — I700 Atherosclerosis of aorta: Secondary | ICD-10-CM | POA: Insufficient documentation

## 2016-03-22 LAB — HEPATITIS C ANTIBODY

## 2016-03-22 LAB — HEPATITIS B SURFACE ANTIBODY,QUALITATIVE: HEP B SURFACE AB, QUAL: NONREACTIVE

## 2016-03-22 NOTE — Addendum Note (Signed)
Addended by: Colan Neptune E on: 03/22/2016 12:21 PM   Modules accepted: Orders

## 2016-03-26 ENCOUNTER — Ambulatory Visit: Payer: Self-pay

## 2016-04-03 ENCOUNTER — Ambulatory Visit
Admission: RE | Admit: 2016-04-03 | Discharge: 2016-04-03 | Disposition: A | Discharge status: Home / Self Care | Payer: Self-pay | Source: Ambulatory Visit | Attending: Internal Medicine | Admitting: Internal Medicine

## 2016-04-03 DIAGNOSIS — K219 Gastro-esophageal reflux disease without esophagitis: Secondary | ICD-10-CM | POA: Insufficient documentation

## 2016-04-03 DIAGNOSIS — R1310 Dysphagia, unspecified: Secondary | ICD-10-CM | POA: Insufficient documentation

## 2016-04-05 ENCOUNTER — Telehealth: Payer: Self-pay | Admitting: Nurse Practitioner

## 2016-04-05 NOTE — Telephone Encounter (Signed)
I called Ginnie back, she answered and I let her know that she can talk to Dr. Mable Fill about the endocrinology group at her upcoming appointment.

## 2016-04-05 NOTE — Telephone Encounter (Signed)
Maizley wanted to schedule an appointment for the endocrinology group.

## 2016-04-11 ENCOUNTER — Encounter: Payer: Self-pay | Admitting: Internal Medicine

## 2016-04-11 ENCOUNTER — Ambulatory Visit: Payer: Self-pay | Admitting: Internal Medicine

## 2016-04-11 VITALS — BP 138/84 | HR 74 | Temp 98.1°F | Wt 198.0 lb

## 2016-04-11 DIAGNOSIS — M419 Scoliosis, unspecified: Secondary | ICD-10-CM

## 2016-04-11 DIAGNOSIS — Z1159 Encounter for screening for other viral diseases: Secondary | ICD-10-CM

## 2016-04-11 DIAGNOSIS — Z23 Encounter for immunization: Secondary | ICD-10-CM

## 2016-04-11 NOTE — Patient Instructions (Signed)
RTC 6 months. Labs prior to Ov- Met c, cbc, Ua, TSH, lipid

## 2016-04-11 NOTE — Progress Notes (Signed)
   Subjective:    Patient ID: Brittney Chavez, female    DOB: 09-14-1960, 55 y.o.   MRN: [id number]  HPI  Patient Active Problem List   Diagnosis Date Noted  . Scoliosis 03/21/2016  . History of underactive thyroid 03/21/2016  . Dysphagia 03/21/2016   Pt presents to get results of labs. Pt wanting to stop smoking. Bp is elevated today. Pt complains of back and side hurting from coughing. Pt states she has a lot of drainage that will not come up. Head feels full. Pt concerned about her weight. She states she has been watching what she eats. Pt complains of choking while eating.                      Review of Systems     Objective:   Physical Exam  Constitutional: She is oriented to person, place, and time.  HENT:  Right Ear: External ear normal.  Left Ear: External ear normal.  Mouth/Throat: Oropharynx is clear and moist.  Cardiovascular: Normal rate, regular rhythm and normal heart sounds.   Pulmonary/Chest: Effort normal and breath sounds normal.  Neurological: She is alert and oriented to person, place, and time. She has normal reflexes.    BP (!) 164/102   Pulse 74   Temp 98.1 F (36.7 C) (Oral)   Wt 198 lb (89.8 kg)   LMP 03/07/2012 (Approximate)   BMI 31.82 kg/m     Medication List       Accurate as of 04/11/16 11:36 AM. Always use your most recent med list.          albuterol 108 (90 Base) MCG/ACT inhaler Commonly known as:  PROVENTIL HFA;VENTOLIN HFA Inhale into the lungs every 6 (six) hours as needed for wheezing or shortness of breath.   CLARITIN PO Take by mouth.   gabapentin 600 MG tablet Commonly known as:  NEURONTIN Take 600 mg by mouth.   ibuprofen 800 MG tablet Commonly known as:  ADVIL,MOTRIN Take 1 tablet (800 mg total) by mouth every 8 (eight) hours as needed.   levothyroxine 100 MCG tablet Commonly known as:  SYNTHROID, LEVOTHROID Take by mouth.   omeprazole 40 MG capsule Commonly known as:  PRILOSEC Take 40 mg by mouth daily.    VALTREX PO Take by mouth.            Assessment & Plan:  Labs look good. Needs a non smoking program. Lungs sound good. PT can watch size of portions to help with weight gain. Checked Bp again it is 138/84. Normal bp. Recommends aleve, advil, and tylenol for pain. Use warm compress and hot showers for pain. Recommend gym and stretch exercises regularly. Draw hep c and met c labs today. Take smaller bites and do not talk while eating will help with choking while eating. Regular bowel movements help as well. Ears look clear and not red or inflamed. RTC 6 months. Labs prior to Ov.

## 2016-04-12 ENCOUNTER — Ambulatory Visit: Payer: Self-pay | Admitting: Ophthalmology

## 2016-04-13 LAB — COMPREHENSIVE METABOLIC PANEL
A/G RATIO: 1.1 — AB (ref 1.2–2.2)
ALT: 30 IU/L (ref 0–32)
AST: 22 IU/L (ref 0–40)
Albumin: 4.3 g/dL (ref 3.5–5.5)
Alkaline Phosphatase: 71 IU/L (ref 39–117)
BUN/Creatinine Ratio: 16 (ref 9–23)
BUN: 13 mg/dL (ref 6–24)
Bilirubin Total: 0.3 mg/dL (ref 0.0–1.2)
CALCIUM: 9.5 mg/dL (ref 8.7–10.2)
CO2: 26 mmol/L (ref 18–29)
Chloride: 102 mmol/L (ref 96–106)
Creatinine, Ser: 0.81 mg/dL (ref 0.57–1.00)
GFR, EST AFRICAN AMERICAN: 95 mL/min/{1.73_m2} (ref >59)
GFR, EST NON AFRICAN AMERICAN: 83 mL/min/{1.73_m2} (ref >59)
Globulin, Total: 3.9 g/dL (ref 1.5–4.5)
Glucose: 92 mg/dL (ref 65–99)
POTASSIUM: 4.3 mmol/L (ref 3.5–5.2)
Sodium: 147 mmol/L — ABNORMAL HIGH (ref 134–144)
TOTAL PROTEIN: 8.2 g/dL (ref 6.0–8.5)

## 2016-04-13 LAB — HCV RNA (INTERNATIONAL UNITS)
HCV RNA (International Units): 22700000 IU/mL
HCV log10: 7.356 log10 IU/mL

## 2016-04-13 LAB — HCV RNA QUANT

## 2016-04-13 LAB — HEPATITIS C VRS RNA DETECT BY PCR-QUAL: HCV RNA NAA Qualitative: POSITIVE — AB

## 2016-04-17 ENCOUNTER — Other Ambulatory Visit: Payer: Self-pay | Admitting: Obstetrics and Gynecology

## 2016-04-17 ENCOUNTER — Ambulatory Visit (INDEPENDENT_AMBULATORY_CARE_PROVIDER_SITE_OTHER): Payer: Self-pay | Admitting: Obstetrics and Gynecology

## 2016-04-17 ENCOUNTER — Encounter: Payer: Self-pay | Admitting: Obstetrics and Gynecology

## 2016-04-17 VITALS — BP 166/83 | HR 83 | Ht 64.0 in | Wt 199.5 lb

## 2016-04-17 DIAGNOSIS — N904 Leukoplakia of vulva: Secondary | ICD-10-CM

## 2016-04-17 DIAGNOSIS — R87810 Cervical high risk human papillomavirus (HPV) DNA test positive: Secondary | ICD-10-CM

## 2016-04-17 DIAGNOSIS — R87610 Atypical squamous cells of undetermined significance on cytologic smear of cervix (ASC-US): Secondary | ICD-10-CM

## 2016-04-17 DIAGNOSIS — Z720 Tobacco use: Secondary | ICD-10-CM

## 2016-04-17 DIAGNOSIS — N9089 Other specified noninflammatory disorders of vulva and perineum: Secondary | ICD-10-CM

## 2016-04-17 NOTE — Patient Instructions (Addendum)
1. Colposcopy with biopsies are done today 2. Return in 2 weeks for follow-up on biopsies and possible vulvar colposcopy with biopsy    Colposcopy, Care After Refer to this sheet in the next few weeks. These instructions provide you with information on caring for yourself after your procedure. Your health care provider may also give you more specific instructions. Your treatment has been planned according to current medical practices, but problems sometimes occur. Call your health care provider if you have any problems or questions after your procedure. WHAT TO EXPECT AFTER THE PROCEDURE  After your procedure, it is typical to have the following:  Cramping. This often goes away in a few minutes.  Soreness. This may last for 2 days.  Lightheadedness. Lie down for a few minutes if this occurs. You may also have some bleeding or dark discharge for a few days. You may need to wear a sanitary pad during this time. HOME CARE INSTRUCTIONS  Avoid sex, douching, and using tampons for 3 days or as directed by your health care provider.  Only take over-the-counter or prescription medicines as directed by your health care provider. Do not take aspirin because it can cause bleeding.  Continue to take birth control pills if you are on them.  Not all test results are available during your visit. If your test results are not back during the visit, make an appointment with your health care provider to find out the results. Do not assume everything is normal if you have not heard from your health care provider or the medical facility. It is important for you to follow up on all of your test results.  Follow your health care provider's advice regarding activity, follow-up visits, and follow-up Pap tests. SEEK MEDICAL CARE IF:  You develop a rash.  You have problems with your medicine. SEEK IMMEDIATE MEDICAL CARE IF:  You are bleeding heavily or are passing blood clots.  You have a fever.  You have  abnormal vaginal discharge.  You are having cramps that do not go away after taking your pain medicine.  You feel lightheaded, dizzy, or faint.  You have stomach pain.   This information is not intended to replace advice given to you by your health care provider. Make sure you discuss any questions you have with your health care provider.   Document Released: 04/01/2013 Document Reviewed: 04/01/2013 Elsevier Interactive Patient Education [employer].    Colposcopy Colposcopy is a procedure to examine your cervix and vagina, or the area around the outside of your vagina, for abnormalities or signs of disease. The procedure is done using a lighted microscope called a colposcope. Tissue samples may be collected during the colposcopy if your health care provider finds any unusual cells. A colposcopy may be done if a woman has:  An abnormal Pap test. A Pap test is a medical test done to evaluate cells that are on the surface of the cervix.  A Pap test result that is suggestive of human papillomavirus (HPV). This virus can cause genital warts and is linked to the development of cervical cancer.  A sore on her cervix and the results of a Pap test were normal.  Genital warts on the cervix or in or around the outside of the vagina.  A mother who took the drug diethylstilbestrol (DES) while pregnant.  Painful intercourse.  Vaginal bleeding, especially after sexual intercourse. LET [facility] CARE PROVIDER KNOW ABOUT:  Any allergies you have.  All medicines you are taking,  including vitamins, herbs, eye drops, creams, and over-the-counter medicines.  Previous problems you or members of your family have had with the use of anesthetics.  Any blood disorders you have.  Previous surgeries you have had.  Medical conditions you have. RISKS AND COMPLICATIONS Generally, a colposcopy is a safe procedure. However, as with any procedure, complications can occur. Possible complications  include:  Bleeding.  Infection.  Missed lesions. BEFORE THE PROCEDURE   Tell your health care provider if you have your menstrual period. A colposcopy typically is not done during menstruation.  For 24 hours before the colposcopy, do not:  [place].  Use tampons.  Use medicines, creams, or suppositories in the vagina.  Have sexual intercourse. PROCEDURE  During the procedure, you will be lying on your back with your feet in foot rests (stirrups). A warm metal or plastic instrument (speculum) will be placed in your vagina to keep it open and to allow the health care provider to see the cervix. The colposcope will be placed outside the vagina. It will be used to magnify and examine the cervix, vagina, and the area around the outside of the vagina. A small amount of liquid solution will be placed on the area that is to be viewed. This solution will make it easier to see the abnormal cells. Your health care provider will use tools to suck out mucus and cells from the canal of the cervix. Then he or she will record the location of the abnormal areas. If a biopsy is done during the procedure, a medicine will usually be given to numb the area (local anesthetic). You may feel mild pain or cramping while the biopsy is done. After the procedure, tissue samples collected during the biopsy will be sent to a lab for analysis. AFTER THE PROCEDURE  You will be given instructions on when to follow up with your health care provider for your test results. It is important to keep your appointment.   This information is not intended to replace advice given to you by your health care provider. Make sure you discuss any questions you have with your health care provider.   Document Released: 09/01/2002 Document Revised: 02/11/2013 Document Reviewed: 01/08/2013 Elsevier Interactive Patient Education [employer].

## 2016-04-17 NOTE — Progress Notes (Signed)
GYN ENCOUNTER NOTE  Subjective:       Brittney Chavez is a 55 y.o. [id number] female is here for gynecologic evaluation of the following issues:  1. ASCUS/positive HPV Pap smear.    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 Patient is a chronic active 1 pack a day smoker. New partner in May 2017. 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 Also has vulvar lesion of right labia which needs to be addressed   Gynecologic History Patient's last menstrual period was 03/07/2012 (approximate). Contraception: post menopausal status Last Pap: ASCUS/positive  Obstetric History OB History  Gravida Para Term Preterm AB Living  5 2 2   3 2   SAB TAB Ectopic Multiple Live Births  3       2    # Outcome Date GA Lbr Len/2nd Weight Sex Delivery Anes PTL Lv  5 Term 1988   7 lb 2.1 oz (3.234 kg)  Vag-Spont   LIV  4 Term 1985   7 lb 1.8 oz (3.225 kg)  Vag-Spont   LIV  3 SAB           2 SAB           1 SAB               Past Medical History:  Diagnosis Date  . Abnormal Pap smear of cervix   . Arthritis   . Hepatitis C   . Scoliosis   . Thyroid disease     Past Surgical History:  Procedure Laterality Date  . CERVICAL CONE BIOPSY    . CERVIX LESION DESTRUCTION    . DILATION AND CURETTAGE OF UTERUS     x 2  . NECK SURGERY    . THYROIDECTOMY, PARTIAL Bilateral   . TUBAL LIGATION      Current Outpatient Prescriptions on File Prior to Visit  Medication Sig Dispense Refill  . acetaminophen (TYLENOL) 500 MG tablet Take 500 mg by mouth every 6 (six) hours as needed.    Marland Kitchen albuterol (PROVENTIL HFA;VENTOLIN HFA) 108 (90 Base) MCG/ACT inhaler Inhale into the lungs every 6 (six) hours as needed for wheezing or shortness of breath.    . gabapentin (NEURONTIN) 600 MG tablet Take 600 mg by mouth.    Marland Kitchen ibuprofen (ADVIL,MOTRIN) 800 MG tablet Take 1 tablet (800 mg total) by mouth every 8 (eight) hours as  needed. 30 tablet 0  . levothyroxine (SYNTHROID, LEVOTHROID) 100 MCG tablet Take by mouth.    . Loratadine (CLARITIN PO) Take by mouth.    Marland Kitchen omeprazole (PRILOSEC) 40 MG capsule Take 40 mg by mouth daily.    . ValACYclovir HCl (VALTREX PO) Take by mouth.     No current facility-administered medications on file prior to visit.     Allergies  Allergen Reactions  . Nsaids Nausea Only    Other reaction(s): OTHER    Social History   Social History  . Marital status: Single    Spouse name: N/A  . Number of children: N/A  . Years of education: N/A   Occupational History  . Not on file.   Social History Main Topics  . Smoking status: Current Every Day Smoker    Packs/day: 1.00    Years: 30.00    Types: Cigarettes  . Smokeless tobacco: Current User  . Alcohol use No  . Drug use:     Types:  Marijuana     Comment: 2 x a week  . Sexual activity: Yes    Birth control/ protection: Post-menopausal   Other Topics Concern  . Not on file   Social History Narrative  . No narrative on file    Family History  Problem Relation Age of Onset  . Cancer Mother   . Diabetes Mother   . COPD Mother   . Breast cancer Mother 40  . COPD Father   . Stroke Father   . Polycystic ovary syndrome Daughter   . Birth defects Maternal Grandmother   . Colon cancer Maternal Grandmother   . Birth defects Paternal Grandmother   . Ovarian cancer Neg Hx   . Heart disease Neg Hx     The following portions of the patient's history were reviewed and updated as appropriate: allergies, current medications, past family history, past medical history, past social history, past surgical history and problem list.  Review of Systems Review of Systems -Per history of present illness Objective:   BP (!) 166/83   Pulse 83   Ht 5\' 4"  (1.626 m)   Wt 199 lb 8 oz (90.5 kg)   LMP 03/07/2012 (Approximate)   BMI 34.24 kg/m  CONSTITUTIONAL: Well-developed, well-nourished female in no acute distress.  HENT:   Normocephalic, atraumatic.  NECK: Not examined SKIN: Skin is warm and dry. No rash noted. Not diaphoretic. No erythema. No pallor. [place]: Alert and oriented to person, place, and time. PSYCHIATRIC: Normal mood and affect. Normal behavior. Normal judgment and thought content. CARDIOVASCULAR:Not Examined RESPIRATORY: Not Examined BREASTS: Not Examined ABDOMEN: Soft, non distended; Non tender.  No Organomegaly. PELVIC:  External Genitalia: Right labia minora with 10 x 7 mm leukoplakia lesion, non-ulcerated  BUS: Normal  Vagina: Mild atrophic changes  Cervix: Parous; no lesions  Uterus: Normal size, shape,consistency, mobile  Adnexa: Normal  RV: Normal external exam  Bladder: Nontender MUSCULOSKELETAL: Normal range of motion. No tenderness.  No cyanosis, clubbing, or edema.  PROCEDURE: Colposcopy of upper adjacent vagina and cervix with biopsies Indications: ASCUS/positive Pap smear Findings: Inadequate colposcopy; no ectocervical lesions seen; vagina without lesions Biopsies:   ECC Description: Verbal consent for colposcopy and biopsies is obtained. Patient is laced in the dorsal lithotomy position. Inspection of the vulva reveals a 10 x 7 mm leukoplakia lesion on right labia minora without ulceration. Graves' speculum was placed in the vagina to facilitate visualization of the cervix. Cervix and vaginal sidewalls are painted with acetic acid solution. Colposcopy demonstrates no abnormal lesions of the vagina or cervix. The S CJ cannot be visualized-therefore colposcopy is inadequate. ECC is performed with a serrated curette. Specimen was sent to pathology. Minimal bleeding is encountered. Procedure was well-tolerated. It is anticipated that the patient may need vulvar colposcopy with biopsy of the leukoplakia lesion to help complete the assessment of the ASCUS/positive high risk HPV Pap smear. Disposition: Patient will return in 2 weeks for follow-up; vulvar colposcopy with biopsy is  planned (if  authorized through [facility] program.     Assessment:   1. ASCUS with positive high risk HPV cervical  2. Vulvar lesion   3. Tobacco user  4. Leukoplakia of labia   Plan:   1. Colposcopy with biopsies as noted (ECC) 2. Return in 2 weeks for follow-up on biopsies and probable vulvar colposcopy with biopsy 3. Smoking cessation encouraged 4. Condom use encouraged  Brayton Mars, MD  Note: This dictation was prepared with Dragon dictation along with smaller phrase technology. Any transcriptional  errors that result from this process are unintentional.

## 2016-04-18 ENCOUNTER — Telehealth: Payer: Self-pay

## 2016-04-18 NOTE — Telephone Encounter (Signed)
Called pt with results of her esophagus xray. Results were normal with some mild reflux. PT verbalized understanding.

## 2016-04-19 LAB — PATHOLOGY

## 2016-04-23 ENCOUNTER — Encounter: Payer: Self-pay | Admitting: Obstetrics and Gynecology

## 2016-05-02 ENCOUNTER — Encounter: Payer: Self-pay | Admitting: Obstetrics and Gynecology

## 2016-05-02 ENCOUNTER — Ambulatory Visit (INDEPENDENT_AMBULATORY_CARE_PROVIDER_SITE_OTHER): Payer: Self-pay | Admitting: Obstetrics and Gynecology

## 2016-05-02 ENCOUNTER — Other Ambulatory Visit: Payer: Self-pay

## 2016-05-02 VITALS — BP 142/82 | HR 76 | Wt 198.2 lb

## 2016-05-02 DIAGNOSIS — N9089 Other specified noninflammatory disorders of vulva and perineum: Secondary | ICD-10-CM

## 2016-05-02 DIAGNOSIS — N904 Leukoplakia of vulva: Secondary | ICD-10-CM

## 2016-05-02 DIAGNOSIS — N879 Dysplasia of cervix uteri, unspecified: Secondary | ICD-10-CM

## 2016-05-02 NOTE — Patient Instructions (Signed)
VULVAR BIOPSY POST-PROCEDURE INSTRUCTIONS  1. You may take Ibuprofen, Aleve or Tylenol for pain if needed.    2. You may have a small amount of spotting.  You should wear a mini pad for the next few days.  3. You may use some topical Neosporin ointment if you would like (over the counter is fine).  4. You need to call if you have redness around the biopsy site, if there is any unusual draining, if the bleeding is heavy, or if you are concerned.  5. Shower or bathe as normal  6. We will discuss the results at your follow-up appointment if needed.

## 2016-05-02 NOTE — Progress Notes (Signed)
Chief complaint: 1. Vulvar lesion 2. ASCUS/positive high risk HPV Pap smear  Patient presents for vulvar biopsy of a leukoplakia lesion noted on the right labia minora. Review of patient's colposcopic directed biopsies for evaluation of abnormal Pap smear is notable for mild dysplasia on the ECC. No ectocervical biopsies were taken.  Past medical history, past surgical history, problem list, medications, and allergies are reviewed  OBJECTIVE: BP (!) 142/82   Pulse 76   Wt 198 lb 3.2 oz (89.9 kg)   LMP 03/07/2012 (Approximate)   BMI 34.02 kg/m  Pleasant female in no acute distress, slightly anxious Pelvic exam: External genitalia-7 mm leukoplakia lesion right labia minora (biopsy today) BUS-normal Vagina-normal Cervix-parous; a 10 mm annular leukoplakia lesion is noted at 7:00 (not previously seen at the time of colposcopy 2 weeks ago) Bimanual-not done  PROCEDURE: Vulvar biopsy Verbal consent is obtained. The vulva is cleansed with Betadine solution. 4 cc of 1% lidocaine without epinephrine is injected locally into the biopsy site. 4.5 mm punch biopsy is taken of the lesion, and sent to pathology. 3-0 Vicryl repeat suture is used to place 3 separate stitches for hemostasis. Blood loss-minimal. Complications-none. Postprocedure instructions were given.   ASSESSMENT: 1. ASCUS/positive high risk HPV Pap smear 2. Positive ECC with mild dysplasia cells seen 3. Right labia minora leukoplakia, biopsied today  PLAN: 1. Vulvar biopsy as noted 2. Return in 1 week for preoperative appointment prior to LEEP procedure 3. Schedule LEEP cone biopsy of the cervix-2 weeks  A total of 15 minutes were spent face-to-face with the patient during this encounter and over half of that time dealt with counseling and coordination of care.  Brayton Mars, MD  Note: This dictation was prepared with Dragon dictation along with smaller phrase technology. Any transcriptional errors that result from  this process are unintentional.

## 2016-05-04 LAB — PATHOLOGY

## 2016-05-04 NOTE — Progress Notes (Signed)
Noted patient is scheduled for LEEP procedure on 05/21/16 with Dr. Enzo Bi.  Her cervical biopsy results were LSIL.  Contacted [facility] nursing consultant Erma Pinto to determine whether LEEP covered under [facility], since patient does not qualify for [facility] with LSIL.  Per consultant this procedure will not be covered.  Notified patient, and [facility]. Explained patient will be a self pay.  If pathology comes back as a high grade lesion, [facility] application will be submitted.  Emailed copy of [employer] assistance application to patient.

## 2016-05-08 ENCOUNTER — Encounter: Payer: Self-pay | Admitting: Obstetrics and Gynecology

## 2016-05-08 ENCOUNTER — Encounter
Admission: RE | Admit: 2016-05-08 | Discharge: 2016-05-08 | Disposition: A | Discharge status: Home / Self Care | Payer: PRIVATE HEALTH INSURANCE | Source: Ambulatory Visit | Attending: Obstetrics and Gynecology | Admitting: Obstetrics and Gynecology

## 2016-05-08 ENCOUNTER — Ambulatory Visit (INDEPENDENT_AMBULATORY_CARE_PROVIDER_SITE_OTHER): Payer: Self-pay | Admitting: Obstetrics and Gynecology

## 2016-05-08 VITALS — BP 169/84 | HR 76 | Ht 64.0 in | Wt 201.6 lb

## 2016-05-08 DIAGNOSIS — M5136 Other intervertebral disc degeneration, lumbar region: Secondary | ICD-10-CM | POA: Insufficient documentation

## 2016-05-08 DIAGNOSIS — N879 Dysplasia of cervix uteri, unspecified: Secondary | ICD-10-CM

## 2016-05-08 DIAGNOSIS — F17210 Nicotine dependence, cigarettes, uncomplicated: Secondary | ICD-10-CM | POA: Insufficient documentation

## 2016-05-08 DIAGNOSIS — Z01818 Encounter for other preprocedural examination: Secondary | ICD-10-CM

## 2016-05-08 DIAGNOSIS — N901 Moderate vulvar dysplasia: Secondary | ICD-10-CM | POA: Insufficient documentation

## 2016-05-08 DIAGNOSIS — M1990 Unspecified osteoarthritis, unspecified site: Secondary | ICD-10-CM | POA: Insufficient documentation

## 2016-05-08 DIAGNOSIS — K219 Gastro-esophageal reflux disease without esophagitis: Secondary | ICD-10-CM | POA: Insufficient documentation

## 2016-05-08 DIAGNOSIS — Z01812 Encounter for preprocedural laboratory examination: Secondary | ICD-10-CM | POA: Insufficient documentation

## 2016-05-08 DIAGNOSIS — F419 Anxiety disorder, unspecified: Secondary | ICD-10-CM | POA: Insufficient documentation

## 2016-05-08 DIAGNOSIS — F4310 Post-traumatic stress disorder, unspecified: Secondary | ICD-10-CM | POA: Insufficient documentation

## 2016-05-08 DIAGNOSIS — N9089 Other specified noninflammatory disorders of vulva and perineum: Secondary | ICD-10-CM

## 2016-05-08 DIAGNOSIS — N904 Leukoplakia of vulva: Secondary | ICD-10-CM

## 2016-05-08 DIAGNOSIS — E039 Hypothyroidism, unspecified: Secondary | ICD-10-CM | POA: Insufficient documentation

## 2016-05-08 HISTORY — DX: Post-traumatic stress disorder, unspecified: F43.10

## 2016-05-08 HISTORY — DX: Anxiety disorder, unspecified: F41.9

## 2016-05-08 HISTORY — DX: Gastro-esophageal reflux disease without esophagitis: K21.9

## 2016-05-08 HISTORY — DX: Hypothyroidism, unspecified: E03.9

## 2016-05-08 HISTORY — DX: Other intervertebral disc degeneration, lumbar region without mention of lumbar back pain or lower extremity pain: M51.369

## 2016-05-08 HISTORY — DX: Sprain of anterior cruciate ligament of unspecified knee, initial encounter: S83.519A

## 2016-05-08 HISTORY — DX: Malignant (primary) neoplasm, unspecified: C80.1

## 2016-05-08 HISTORY — DX: Other intervertebral disc degeneration, lumbar region: M51.36

## 2016-05-08 LAB — CBC WITH DIFFERENTIAL/PLATELET
Basophils Absolute: 0.1 10*3/uL (ref 0–0.1)
Basophils Relative: 1 %
EOS ABS: 0.2 10*3/uL (ref 0–0.7)
EOS PCT: 3 %
HCT: 41.1 % (ref 35.0–47.0)
Hemoglobin: 13.9 g/dL (ref 12.0–16.0)
LYMPHS ABS: 2.9 10*3/uL (ref 1.0–3.6)
Lymphocytes Relative: 33 %
MCH: 30.7 pg (ref 26.0–34.0)
MCHC: 33.9 g/dL (ref 32.0–36.0)
MCV: 90.7 fL (ref 80.0–100.0)
MONOS PCT: 8 %
Monocytes Absolute: 0.7 10*3/uL (ref 0.2–0.9)
Neutro Abs: 5 10*3/uL (ref 1.4–6.5)
Neutrophils Relative %: 55 %
PLATELETS: 284 10*3/uL (ref 150–440)
RBC: 4.54 MIL/uL (ref 3.80–5.20)
RDW: 14.3 % (ref 11.5–14.5)
WBC: 8.8 10*3/uL (ref 3.6–11.0)

## 2016-05-08 LAB — RAPID HIV SCREEN (HIV 1/2 AB+AG)
HIV 1/2 Antibodies: NONREACTIVE
HIV-1 P24 ANTIGEN - HIV24: NONREACTIVE

## 2016-05-08 LAB — SURGICAL PCR SCREEN
MRSA, PCR: NEGATIVE
Staphylococcus aureus: NEGATIVE

## 2016-05-08 MED ORDER — LIDOCAINE 0.5 % EX GEL: 0.5000 | Freq: Two times a day (BID) | CUTANEOUS | 0 refills | Status: DC

## 2016-05-08 MED ORDER — LIDOCAINE 5 % EX OINT: 1.0000 "application " | TOPICAL_OINTMENT | CUTANEOUS | 0 refills | Status: DC | PRN

## 2016-05-08 NOTE — H&P (Signed)
Subjective:  PREOPERATIVE HISTORY AND PHYSICAL    Date of surgery: 06/04/2016 Procedures:  LEEP cone biopsy  Wide local excision of right labia minora lesion   Patient is a 55 y.o. 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 Vulvar biopsy was performed in late October 2017.  Patient has history of high-grade dysplasia in the past; status post cold knife conization of the cervix in the 1980s; she also is status post cryotherapy in the 1980s. Patient is a chronic active 1 pack a day smoker. Patient has new partner in May 2017. 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 Contraception: post menopausal status Last Pap: ASCUS/positive   Menstrual History: OB History    Gravida Para Term Preterm AB Living   5 2 2   3 2    SAB TAB Ectopic Multiple Live Births   3       2      Menarche age: NA Patient's last menstrual period was 03/07/2012 (approximate).    Past Medical History:  Diagnosis Date  . Abnormal Pap smear of cervix   . Anxiety   . Arthritis   . 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 DDD (degenerative disc disease), lumbar   . GERD (gastroesophageal reflux disease)   . Hepatitis C   . Hypothyroidism   . PTSD (post-traumatic stress disorder)   . Scoliosis   . Thyroid disease   . Torn ACL (anterior cruciate ligament)    Left Knee    Past Surgical History:  Procedure Laterality Date  . CERVICAL CONE BIOPSY    . CERVIX LESION DESTRUCTION    . DILATION AND CURETTAGE OF UTERUS     x 2  . NECK SURGERY     Cervical Fusion  . THYROIDECTOMY, PARTIAL Bilateral   . TONSILLECTOMY     as a child  . TUBAL LIGATION      OB History  Gravida Para  Term Preterm AB Living  5 2 2   3 2   SAB TAB Ectopic Multiple Live Births  3       2    # Outcome Date GA Lbr Len/2nd Weight Sex Delivery Anes PTL Lv  5 Term 1988   7 lb 2.1 oz (3.234 kg)  Vag-Spont   LIV  4 Term 1985   7 lb 1.8 oz (3.225 kg)  Vag-Spont   LIV  3 SAB           2 SAB           1 SAB               Social History   Social History  . Marital status: Single    Spouse name: N/A  . Number of children: N/A  . Years of education: N/A   Social History Main Topics  . Smoking status: Current Every Day Smoker    Packs/day: 1.00    Years: 30.00    Types: Cigarettes  . Smokeless tobacco: Never Used  . Alcohol use No  . Drug use:     Types: Marijuana     Comment: 2 x a week  . Sexual activity: Yes    Birth control/ protection: Post-menopausal  Other Topics Concern  . None   Social History Narrative  . None    Family History  Problem Relation Age of Onset  . Cancer Mother   . Diabetes Mother   . COPD Mother   . Breast cancer Mother 70  . COPD Father   . Stroke Father   . Polycystic ovary syndrome Daughter   . Birth defects Maternal Grandmother   . Colon cancer Maternal Grandmother   . Birth defects Paternal Grandmother   . Ovarian cancer Neg Hx   . Heart disease Neg Hx      (Not in a hospital admission)  Allergies  Allergen Reactions  . Nsaids Nausea Only    Other reaction(s): OTHER    Review of Systems Constitutional: No recent fever/chills/sweats Respiratory: No recent cough/bronchitis Cardiovascular: No chest pain Gastrointestinal: No recent nausea/vomiting/diarrhea Genitourinary: No UTI symptoms Hematologic/lymphatic:No history of coagulopathy or recent blood thinner use    Objective:    BP (!) 169/84   Pulse 76   Ht 5\' 4"  (1.626 m)   Wt 201 lb 9.6 oz (91.4 kg)   LMP 03/07/2012 (Approximate)   BMI 34.60 kg/m   General:   Normal  Skin:   normal  HEENT:  Normal  Neck:  Supple without Adenopathy or Thyromegaly  Lungs:   Heart:               Breasts:   Abdomen:  Pelvis:  M/S   Extremeties:  Neuro:    clear to auscultation bilaterally   Normal without murmur   Not Examined   soft, non-tender; bowel sounds normal; no masses,  no organomegaly   Exam deferred to OR  No CVAT  Warm/Dry   Normal        05/02/2016 Pelvic exam: External genitalia-7 mm leukoplakia lesion right labia minora (biopsy today) BUS-normal Vagina-normal Cervix-parous; a 10 mm annular leukoplakia lesion is noted at 7:00 (not previously seen at the time of colposcopy 2 weeks ago) Bimanual-not done  Assessment:     1. High-grade cervical dysplasia with positive ECC 2. 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 LEEP cone biopsy of the cervix 2. Wide local excision of right labia minora lesion   Preop counseling: Patient is to undergo LEEP cone biopsy of the cervix as well as wide local excision of the right labia minora lesion on 06/04/2016. She is understanding of the planned procedure and is aware of and is accepting of all surgical risks which include but are not limited to bleeding, infection, pelvic organ injury with need for repair, blood clot disorders, anesthesia risks, etc. All questions have been answered. Informed consent is given. Patient is ready and willing to proceed with surgery as scheduled.  Brayton Mars, MD  Note: This dictation was prepared with Dragon dictation along with smaller phrase technology. Any transcriptional errors that result from this process are unintentional.

## 2016-05-08 NOTE — Progress Notes (Signed)
Subjective:  PREOPERATIVE HISTORY AND PHYSICAL    Date of surgery: 06/04/2016 Procedures:  LEEP cone biopsy  Wide local excision of right labia minora lesion   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 Vulvar biopsy was performed in late October 2017.  Patient has history of high-grade dysplasia in the past; status post cold knife conization of the cervix in the 1980s; she also is status post cryotherapy in the 1980s. Patient is a chronic active 1 pack a day smoker. Patient has new partner in May 2017. 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 Contraception: post menopausal status Last Pap: ASCUS/positive   Menstrual History: OB History    Gravida Para Term Preterm AB Living   5 2 2   3 2    SAB TAB Ectopic Multiple Live Births   3       2      Menarche age: NA Patient's last menstrual period was 03/07/2012 (approximate).    Past Medical History:  Diagnosis Date  . Abnormal Pap smear of cervix   . Anxiety   . Arthritis   . 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 DDD (degenerative disc disease), lumbar   . GERD (gastroesophageal reflux disease)   . Hepatitis C   . Hypothyroidism   . PTSD (post-traumatic stress disorder)   . Scoliosis   . Thyroid disease   . Torn ACL (anterior cruciate ligament)    Left Knee    Past Surgical History:  Procedure Laterality Date  . CERVICAL CONE BIOPSY    . CERVIX LESION DESTRUCTION    . DILATION AND CURETTAGE OF UTERUS     x 2  . NECK SURGERY     Cervical Fusion  . THYROIDECTOMY, PARTIAL Bilateral   . TONSILLECTOMY     as a child  . TUBAL LIGATION      OB History  Gravida Para  Term Preterm AB Living  5 2 2   3 2   SAB TAB Ectopic Multiple Live Births  3       2    # Outcome Date GA Lbr Len/2nd Weight Sex Delivery Anes PTL Lv  5 Term 1988   7 lb 2.1 oz (3.234 kg)  Vag-Spont   LIV  4 Term 1985   7 lb 1.8 oz (3.225 kg)  Vag-Spont   LIV  3 SAB           2 SAB           1 SAB               Social History   Social History  . Marital status: Single    Spouse name: N/A  . Number of children: N/A  . Years of education: N/A   Social History Main Topics  . Smoking status: Current Every Day Smoker    Packs/day: 1.00    Years: 30.00    Types: Cigarettes  . Smokeless tobacco: Never Used  . Alcohol use No  . Drug use:     Types: Marijuana     Comment: 2 x a week  . Sexual activity: Yes    Birth control/ protection: Post-menopausal  Other Topics Concern  . None   Social History Narrative  . None    Family History  Problem Relation Age of Onset  . Cancer Mother   . Diabetes Mother   . COPD Mother   . Breast cancer Mother 38  . COPD Father   . Stroke Father   . Polycystic ovary syndrome Daughter   . Birth defects Maternal Grandmother   . Colon cancer Maternal Grandmother   . Birth defects Paternal Grandmother   . Ovarian cancer Neg Hx   . Heart disease Neg Hx      (Not in a hospital admission)  Allergies  Allergen Reactions  . Nsaids Nausea Only    Other reaction(s): OTHER    Review of Systems Constitutional: No recent fever/chills/sweats Respiratory: No recent cough/bronchitis Cardiovascular: No chest pain Gastrointestinal: No recent nausea/vomiting/diarrhea Genitourinary: No UTI symptoms Hematologic/lymphatic:No history of coagulopathy or recent blood thinner use    Objective:    BP (!) 169/84   Pulse 76   Ht 5\' 4"  (1.626 m)   Wt 201 lb 9.6 oz (91.4 kg)   LMP 03/07/2012 (Approximate)   BMI 34.60 kg/m   General:   Normal  Skin:   normal  HEENT:  Normal  Neck:  Supple without Adenopathy or Thyromegaly  Lungs:   Heart:               Breasts:   Abdomen:  Pelvis:  M/S   Extremeties:  Neuro:    clear to auscultation bilaterally   Normal without murmur   Not Examined   soft, non-tender; bowel sounds normal; no masses,  no organomegaly   Exam deferred to OR  No CVAT  Warm/Dry   Normal        05/02/2016 Pelvic exam: External genitalia-7 mm leukoplakia lesion right labia minora (biopsy today) BUS-normal Vagina-normal Cervix-parous; a 10 mm annular leukoplakia lesion is noted at 7:00 (not previously seen at the time of colposcopy 2 weeks ago) Bimanual-not done  Assessment:     1. High-grade cervical dysplasia with positive ECC 2. 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 LEEP cone biopsy of the cervix 2. Wide local excision of right labia minora lesion   Preop counseling: Patient is to undergo LEEP cone biopsy of the cervix as well as wide local excision of the right labia minora lesion on 06/04/2016. She is understanding of the planned procedure and is aware of and is accepting of all surgical risks which include but are not limited to bleeding, infection, pelvic organ injury with need for repair, blood clot disorders, anesthesia risks, etc. All questions have been answered. Informed consent is given. Patient is ready and willing to proceed with surgery as scheduled.  Brayton Mars, MD  Note: This dictation was prepared with Dragon dictation along with smaller phrase technology. Any transcriptional errors that result from this process are unintentional.

## 2016-05-08 NOTE — Patient Instructions (Signed)
  Your procedure is scheduled on: June 04, 2016 (Monday)  Report to Same Day Surgery 2nd floor [facility] To find out your arrival time please call [phone] between 1PM - 3PM on June 01, 2016 (Friday)  Remember: Instructions that are not followed completely may result in serious medical risk, up to and including death, or upon the discretion of your surgeon and anesthesiologist your surgery may need to be rescheduled.    _x___ 1. Do not eat food or drink liquids after midnight. No gum chewing or hard candies.     __x__ 2. No Alcohol for 24 hours before or after surgery.   __x__3. No Smoking for 24 prior to surgery.   ____  4. Bring all medications with you on the day of surgery if instructed.    __x__ 5. Notify your doctor if there is any change in your medical condition     (cold, fever, infections).     Do not wear jewelry, make-up, hairpins, clips or nail polish.  Do not wear lotions, powders, or perfumes. You may wear deodorant.  Do not shave 48 hours prior to surgery. Men may shave face and neck.  Do not bring valuables to the hospital.    [facility] is not responsible for any belongings or valuables.               Contacts, dentures or bridgework may not be worn into surgery.  Leave your suitcase in the car. After surgery it may be brought to your room.  For patients admitted to the hospital, discharge time is determined by your treatment team.   Patients discharged the day of surgery will not be allowed to drive home.    Please read over the following fact sheets that you were given:   [facility] Preparing for Surgery and or MRSA Information   _x___ Take these medicines the morning of surgery with A SIP OF WATER:    1. Gabapentin  2. Levothyroxine  3. Omeprazole  4.  5.  6.  ____Fleets enema or Magnesium Citrate as directed.   ___ Use CHG Soap or sage wipes as directed on instruction sheet   __x__ Use inhalers on the day of surgery and bring to  hospital day of surgery (Use Albuterol inhaler the morning of surgery and bring to hospital)  ____ Stop metformin 2 days prior to surgery    ____ Take 1/2 of usual insulin dose the night before surgery and none on the morning of  Surgery.          _x___ Stop aspirin or coumadin, or plavix (NO ASPIRIN)  x__ Stop Anti-inflammatories such as Advil, Aleve, Ibuprofen, Motrin, Naproxen,          Naprosyn, Goodies powders or aspirin products. Ok to take Tylenol. (Stop Ibuprofen one week prior to surgery)   ____ Stop supplements until after surgery.    ____ Bring C-Pap to the hospital.

## 2016-05-08 NOTE — Patient Instructions (Signed)
1. Return in 1 week after surgery for postop check 2. Lidocaine gel is prescribed for sensitive vulvar biopsy site

## 2016-05-09 LAB — RPR: RPR Ser Ql: NONREACTIVE

## 2016-05-23 ENCOUNTER — Encounter: Payer: Self-pay | Admitting: Internal Medicine

## 2016-05-23 ENCOUNTER — Ambulatory Visit: Payer: Self-pay | Admitting: Internal Medicine

## 2016-05-23 VITALS — BP 156/95 | HR 67 | Temp 98.0°F | Wt 204.0 lb

## 2016-05-23 DIAGNOSIS — M545 Low back pain: Secondary | ICD-10-CM

## 2016-05-23 NOTE — Patient Instructions (Signed)
RTC in 3 months

## 2016-05-23 NOTE — Progress Notes (Signed)
   Subjective:    Patient ID: Brittney Chavez, female    DOB: 09-22-1960, 55 y.o.   MRN: [id number]  HPI  BP (!) 156/95   Pulse 67   Temp 98 F (36.7 C) (Oral)   Wt 204 lb (92.5 kg)   LMP 03/07/2012 (Approximate)   BMI 33.95 kg/m     Medication List       Accurate as of 05/23/16  9:59 AM. Always use your most recent med list.          acetaminophen 500 MG tablet Commonly known as:  TYLENOL Take 500 mg by mouth every 6 (six) hours as needed for mild pain.   albuterol 108 (90 Base) MCG/ACT inhaler Commonly known as:  PROVENTIL HFA;VENTOLIN HFA Inhale 2 puffs into the lungs every 6 (six) hours as needed for wheezing or shortness of breath.   ALLERGY 24-HR PO Take by mouth.   gabapentin 600 MG tablet Commonly known as:  NEURONTIN Take 600 mg by mouth 3 (three) times daily.   levothyroxine 100 MCG tablet Commonly known as:  SYNTHROID, LEVOTHROID Take 100 mcg by mouth daily before breakfast.   omeprazole 40 MG capsule Commonly known as:  PRILOSEC Take 40 mg by mouth 2 (two) times daily.   valACYclovir 1000 MG tablet Commonly known as:  VALTREX Take 1,000 mg by mouth daily as needed. FLARE UPS      Pt will have surgery in Dec. Pt present with back pain. She C/o of low back pain even when sweeping bothers pt. Pain has got worse. Xray was done years ago. Pt c/o of weight gain since June.                    Review of Systems     Objective:   Physical Exam        Assessment & Plan:  Lumbar xray ordered for onset of back pain. Bp is fluctuating. Back pain could be coming from the positions pt was put in to see the cervix as the specialist. Could be strained and pulled. After surgery could take 90 days to settle down. Weight is most likely coming from excessive eating and limited activity. Can take allergy medication as directed on bottle to help with the congestion at night.  RTC in 3 months.

## 2016-05-25 ENCOUNTER — Ambulatory Visit
Admission: RE | Admit: 2016-05-25 | Discharge: 2016-05-25 | Disposition: A | Discharge status: Home / Self Care | Payer: PRIVATE HEALTH INSURANCE | Source: Ambulatory Visit | Attending: Internal Medicine | Admitting: Internal Medicine

## 2016-05-25 DIAGNOSIS — M47816 Spondylosis without myelopathy or radiculopathy, lumbar region: Secondary | ICD-10-CM | POA: Insufficient documentation

## 2016-05-25 DIAGNOSIS — I700 Atherosclerosis of aorta: Secondary | ICD-10-CM | POA: Insufficient documentation

## 2016-05-25 DIAGNOSIS — M545 Low back pain: Secondary | ICD-10-CM

## 2016-06-04 ENCOUNTER — Encounter
Admission: RE | Disposition: A | Discharge status: Home / Self Care | Payer: Self-pay | Source: Ambulatory Visit | Attending: Obstetrics and Gynecology

## 2016-06-04 ENCOUNTER — Ambulatory Visit: Payer: Self-pay | Admitting: Anesthesiology

## 2016-06-04 ENCOUNTER — Ambulatory Visit
Admission: RE | Admit: 2016-06-04 | Discharge: 2016-06-04 | Disposition: A | Discharge status: Home / Self Care | Payer: Self-pay | Source: Ambulatory Visit | Attending: Obstetrics and Gynecology | Admitting: Obstetrics and Gynecology

## 2016-06-04 DIAGNOSIS — F17200 Nicotine dependence, unspecified, uncomplicated: Secondary | ICD-10-CM | POA: Insufficient documentation

## 2016-06-04 DIAGNOSIS — E890 Postprocedural hypothyroidism: Secondary | ICD-10-CM | POA: Insufficient documentation

## 2016-06-04 DIAGNOSIS — N870 Mild cervical dysplasia: Secondary | ICD-10-CM | POA: Insufficient documentation

## 2016-06-04 DIAGNOSIS — Z9889 Other specified postprocedural states: Secondary | ICD-10-CM | POA: Insufficient documentation

## 2016-06-04 DIAGNOSIS — Z823 Family history of stroke: Secondary | ICD-10-CM | POA: Insufficient documentation

## 2016-06-04 DIAGNOSIS — Z800 Family history of malignant neoplasm of digestive organs: Secondary | ICD-10-CM | POA: Insufficient documentation

## 2016-06-04 DIAGNOSIS — N9089 Other specified noninflammatory disorders of vulva and perineum: Secondary | ICD-10-CM

## 2016-06-04 DIAGNOSIS — Z825 Family history of asthma and other chronic lower respiratory diseases: Secondary | ICD-10-CM | POA: Insufficient documentation

## 2016-06-04 DIAGNOSIS — M1990 Unspecified osteoarthritis, unspecified site: Secondary | ICD-10-CM | POA: Insufficient documentation

## 2016-06-04 DIAGNOSIS — Z803 Family history of malignant neoplasm of breast: Secondary | ICD-10-CM | POA: Insufficient documentation

## 2016-06-04 DIAGNOSIS — M5136 Other intervertebral disc degeneration, lumbar region: Secondary | ICD-10-CM | POA: Insufficient documentation

## 2016-06-04 DIAGNOSIS — N901 Moderate vulvar dysplasia: Secondary | ICD-10-CM | POA: Insufficient documentation

## 2016-06-04 DIAGNOSIS — M419 Scoliosis, unspecified: Secondary | ICD-10-CM | POA: Insufficient documentation

## 2016-06-04 DIAGNOSIS — Z9851 Tubal ligation status: Secondary | ICD-10-CM | POA: Insufficient documentation

## 2016-06-04 DIAGNOSIS — Z833 Family history of diabetes mellitus: Secondary | ICD-10-CM | POA: Insufficient documentation

## 2016-06-04 DIAGNOSIS — Z886 Allergy status to analgesic agent status: Secondary | ICD-10-CM | POA: Insufficient documentation

## 2016-06-04 DIAGNOSIS — N904 Leukoplakia of vulva: Secondary | ICD-10-CM

## 2016-06-04 DIAGNOSIS — N879 Dysplasia of cervix uteri, unspecified: Secondary | ICD-10-CM

## 2016-06-04 DIAGNOSIS — K219 Gastro-esophageal reflux disease without esophagitis: Secondary | ICD-10-CM | POA: Insufficient documentation

## 2016-06-04 HISTORY — PX: LEEP: SHX91

## 2016-06-04 LAB — URINE DRUG SCREEN, QUALITATIVE (ARMC ONLY)
AMPHETAMINES, UR SCREEN: NOT DETECTED
BARBITURATES, UR SCREEN: NOT DETECTED
BENZODIAZEPINE, UR SCRN: NOT DETECTED
Cannabinoid 50 Ng, Ur ~~LOC~~: POSITIVE — AB
Cocaine Metabolite,Ur ~~LOC~~: NOT DETECTED
MDMA (Ecstasy)Ur Screen: NOT DETECTED
METHADONE SCREEN, URINE: NOT DETECTED
Opiate, Ur Screen: NOT DETECTED
Phencyclidine (PCP) Ur S: NOT DETECTED
TRICYCLIC, UR SCREEN: NOT DETECTED

## 2016-06-04 SURGERY — LEEP (LOOP ELECTROSURGICAL EXCISION PROCEDURE)
Anesthesia: General | Wound class: Clean Contaminated

## 2016-06-04 MED ORDER — LIDOCAINE HCL (CARDIAC) 20 MG/ML IV SOLN
INTRAVENOUS | Status: DC | PRN
  Administered 2016-06-04: 40 mg via INTRAVENOUS

## 2016-06-04 MED ORDER — FENTANYL CITRATE (PF) 100 MCG/2ML IJ SOLN
INTRAMUSCULAR | Status: AC
  Filled 2016-06-04: qty 2

## 2016-06-04 MED ORDER — ONDANSETRON HCL 4 MG/2ML IJ SOLN
INTRAMUSCULAR | Status: DC | PRN
  Administered 2016-06-04: 4 mg via INTRAVENOUS

## 2016-06-04 MED ORDER — LACTATED RINGERS IV SOLN
INTRAVENOUS | Status: DC
  Administered 2016-06-04: 06:00:00 via INTRAVENOUS

## 2016-06-04 MED ORDER — ACETIC ACID 4% SOLUTION
Status: DC | PRN
  Administered 2016-06-04: 1 via TOPICAL

## 2016-06-04 MED ORDER — DEXAMETHASONE SODIUM PHOSPHATE 10 MG/ML IJ SOLN
INTRAMUSCULAR | Status: DC | PRN
Start: 2016-06-04 — End: 2016-06-04
  Administered 2016-06-04: 10 mg via INTRAVENOUS

## 2016-06-04 MED ORDER — FENTANYL CITRATE (PF) 100 MCG/2ML IJ SOLN
INTRAMUSCULAR | Status: DC | PRN
  Administered 2016-06-04 (×2): 50 ug via INTRAVENOUS

## 2016-06-04 MED ORDER — MIDAZOLAM HCL 2 MG/2ML IJ SOLN
INTRAMUSCULAR | Status: DC | PRN
  Administered 2016-06-04: 2 mg via INTRAVENOUS

## 2016-06-04 MED ORDER — FERRIC SUBSULFATE 259 MG/GM EX SOLN
CUTANEOUS | Status: DC | PRN
  Administered 2016-06-04: 1

## 2016-06-04 MED ORDER — LIDOCAINE-EPINEPHRINE 1 %-1:100000 IJ SOLN
INTRAMUSCULAR | Status: DC | PRN
  Administered 2016-06-04: 30 mL

## 2016-06-04 MED ORDER — OXYCODONE-ACETAMINOPHEN 5-325 MG PO TABS
ORAL_TABLET | ORAL | Status: AC
  Administered 2016-06-04: 1
  Filled 2016-06-04: qty 1

## 2016-06-04 MED ORDER — FENTANYL CITRATE (PF) 100 MCG/2ML IJ SOLN
25.0000 ug | INTRAMUSCULAR | Status: DC | PRN
  Administered 2016-06-04 (×4): 25 ug via INTRAVENOUS

## 2016-06-04 MED ORDER — LACTATED RINGERS IV SOLN
INTRAVENOUS | Status: DC
  Administered 2016-06-04: 08:00:00 via INTRAVENOUS

## 2016-06-04 MED ORDER — FERRIC SUBSULFATE 259 MG/GM EX SOLN
CUTANEOUS | Status: AC
  Filled 2016-06-04: qty 8

## 2016-06-04 MED ORDER — OXYCODONE-ACETAMINOPHEN 5-325 MG PO TABS: 1.0000 | ORAL_TABLET | ORAL | 0 refills | Status: DC | PRN

## 2016-06-04 MED ORDER — LIDOCAINE-EPINEPHRINE 1 %-1:100000 IJ SOLN
INTRAMUSCULAR | Status: AC
  Filled 2016-06-04: qty 1

## 2016-06-04 MED ORDER — ONDANSETRON HCL 4 MG/2ML IJ SOLN: 4.0000 mg | Freq: Once | INTRAMUSCULAR | Status: DC | PRN

## 2016-06-04 MED ORDER — PROPOFOL 10 MG/ML IV BOLUS
INTRAVENOUS | Status: DC | PRN
  Administered 2016-06-04: 200 mg via INTRAVENOUS

## 2016-06-04 MED ORDER — IODINE STRONG (LUGOLS) 5 % PO SOLN
ORAL | Status: DC | PRN
  Administered 2016-06-04: 0.2 mL via ORAL

## 2016-06-04 MED ORDER — ACETIC ACID 3 % SOLN
Status: AC
  Filled 2016-06-04: qty 500

## 2016-06-04 SURGICAL SUPPLY — 39 items
APPLICATOR COTTON TIP 6IN STRL (MISCELLANEOUS) ×3 IMPLANT
APPLICATOR SWAB PROCTO LG 16IN (MISCELLANEOUS) ×3 IMPLANT
CANISTER SUCT 1200ML W/VALVE (MISCELLANEOUS) ×3 IMPLANT
CATH ROBINSON RED A/P 16FR (CATHETERS) ×3 IMPLANT
COUNTER NEEDLE 20/40 LG (NEEDLE) ×3 IMPLANT
DEPRESSOR TONGUE BLADE STERILE (MISCELLANEOUS) ×3 IMPLANT
DRAPE UNDER BUTTOCK W/FLU (DRAPES) ×3 IMPLANT
DRSG TELFA 3X8 NADH (GAUZE/BANDAGES/DRESSINGS) ×3 IMPLANT
ELECT CAUTERY NEEDLE TIP 1.0 (MISCELLANEOUS) ×3
ELECT LEEP BALL 5MM 12CM (MISCELLANEOUS) ×3
ELECT LEEP LOOP 20X10 R2010 (MISCELLANEOUS) ×3
ELECT LOOP 1.0X1.0CM R1010 (MISCELLANEOUS) ×3
ELECT REM PT RETURN 9FT ADLT (ELECTROSURGICAL) ×3
ELECTRODE CAUTERY NEDL TIP 1.0 (MISCELLANEOUS) ×1 IMPLANT
ELECTRODE LEEP BALL 5MM 12CM (MISCELLANEOUS) ×1 IMPLANT
ELECTRODE LEP LOOP 20X10 R2010 (MISCELLANEOUS) ×1 IMPLANT
ELECTRODE LOOP 1.0X1.0CM R1010 (MISCELLANEOUS) ×1 IMPLANT
ELECTRODE REM PT RTRN 9FT ADLT (ELECTROSURGICAL) ×1 IMPLANT
GLOVE BIO SURGEON STRL SZ8 (GLOVE) ×3 IMPLANT
GOWN STRL REUS W/ TWL LRG LVL3 (GOWN DISPOSABLE) ×2 IMPLANT
GOWN STRL REUS W/ TWL XL LVL3 (GOWN DISPOSABLE) ×1 IMPLANT
GOWN STRL REUS W/TWL LRG LVL3 (GOWN DISPOSABLE) ×6
GOWN STRL REUS W/TWL XL LVL3 (GOWN DISPOSABLE) ×3
HANDLE YANKAUER SUCT BULB TIP (MISCELLANEOUS) ×3 IMPLANT
KIT RM TURNOVER CYSTO AR (KITS) ×3 IMPLANT
LABEL OR SOLS (LABEL) ×3 IMPLANT
NEEDLE SPNL 22GX3.5 QUINCKE BK (NEEDLE) ×3 IMPLANT
PACK DNC HYST (MISCELLANEOUS) ×3 IMPLANT
PAD OB MATERNITY 4.3X12.25 (PERSONAL CARE ITEMS) ×3 IMPLANT
PAD PREP 24X41 OB/GYN DISP (PERSONAL CARE ITEMS) ×3 IMPLANT
PENCIL ELECTRO HAND CTR (MISCELLANEOUS) ×3 IMPLANT
SOL PREP PVP 2OZ (MISCELLANEOUS) ×3
SOLUTION PREP PVP 2OZ (MISCELLANEOUS) ×1 IMPLANT
STRAW SMOKE EVAC LEEP 6150 NON (MISCELLANEOUS) ×3 IMPLANT
SUT SILK 2 0 SH (SUTURE) ×6 IMPLANT
SUT VIC AB 3-0 SH 27 (SUTURE) ×9
SUT VIC AB 3-0 SH 27X BRD (SUTURE) ×3 IMPLANT
SYR CONTROL 10ML (SYRINGE) ×3 IMPLANT
TOWEL OR 17X26 4PK STRL BLUE (TOWEL DISPOSABLE) ×3 IMPLANT

## 2016-06-04 NOTE — Discharge Instructions (Signed)

## 2016-06-04 NOTE — Anesthesia Procedure Notes (Signed)
Date/Time: 06/04/2016 7:40 AM Performed by: Allean Found Pre-anesthesia Checklist: Patient identified, Emergency Drugs available, Suction available, Patient being monitored and Timeout performed Patient Re-evaluated:Patient Re-evaluated prior to inductionOxygen Delivery Method: Circle system utilized Preoxygenation: Pre-oxygenation with 100% oxygen Intubation Type: IV induction Ventilation: Mask ventilation without difficulty LMA: LMA inserted LMA Size: 4.0 Number of attempts: 2 Placement Confirmation: positive ETCO2 Tube secured with: Tape Dental Injury: Teeth and Oropharynx as per pre-operative assessment  Difficulty Due To: Difficult Airway- due to reduced neck mobility

## 2016-06-04 NOTE — Progress Notes (Signed)
Slight drainage on pad

## 2016-06-04 NOTE — Anesthesia Postprocedure Evaluation (Signed)
Anesthesia Post Note  Patient: Brittney Chavez  Procedure(s) Performed: Procedure(s) (LRB): LOOP ELECTROSURGICAL EXCISION PROCEDURE (LEEP) (N/A)  Patient location during evaluation: PACU Anesthesia Type: General Level of consciousness: awake and alert Pain management: pain level controlled Vital Signs Assessment: post-procedure vital signs reviewed and stable Respiratory status: spontaneous breathing and respiratory function stable Cardiovascular status: stable Anesthetic complications: no    Last Vitals:  Vitals:   06/04/16 0606 06/04/16 0835  BP: (!) 170/91 130/84  Pulse: 82 87  Resp: 16 15  Temp: 36.2 C 36.6 C    Last Pain:  Vitals:   06/04/16 0835  TempSrc:   PainSc: 0-No pain                 KEPHART,WILLIAM K

## 2016-06-04 NOTE — Anesthesia Preprocedure Evaluation (Signed)
Anesthesia Evaluation  Patient identified by MRN, date of birth, ID band Patient awake    Reviewed: Allergy & Precautions, NPO status , Patient's Chart, lab work & pertinent test results  History of Anesthesia Complications Negative for: history of anesthetic complications  Airway Mallampati: III       Dental   Pulmonary asthma , Current Smoker,  Cough with sinus drainage          Cardiovascular negative cardio ROS       Neuro/Psych negative neurological ROS     GI/Hepatic GERD  Medicated and Controlled,(+) Hepatitis -, C  Endo/Other  Hypothyroidism   Renal/GU negative Renal ROS     Musculoskeletal   Abdominal   Peds  Hematology negative hematology ROS (+)   Anesthesia Other Findings   Reproductive/Obstetrics                             Anesthesia Physical Anesthesia Plan  ASA: III  Anesthesia Plan: General   Post-op Pain Management:    Induction: Intravenous  Airway Management Planned: LMA  Additional Equipment:   Intra-op Plan:   Post-operative Plan:   Informed Consent: I have reviewed the patients History and Physical, chart, labs and discussed the procedure including the risks, benefits and alternatives for the proposed anesthesia with the patient or authorized representative who has indicated his/her understanding and acceptance.     Plan Discussed with:   Anesthesia Plan Comments:         Anesthesia Quick Evaluation

## 2016-06-04 NOTE — Transfer of Care (Signed)
Immediate Anesthesia Transfer of Care Note  Patient: Brittney Chavez  Procedure(s) Performed: Procedure(s): LOOP ELECTROSURGICAL EXCISION PROCEDURE (LEEP) (N/A)  Patient Location: PACU  Anesthesia Type:General  Level of Consciousness: awake  Airway & Oxygen Therapy: Patient Spontanous Breathing and Patient connected to face mask oxygen  Post-op Assessment: Report given to RN and Post -op Vital signs reviewed and stable  Post vital signs: Reviewed and stable  Last Vitals:  Vitals:   06/04/16 0606  BP: (!) 170/91  Pulse: 82  Resp: 16  Temp: 36.2 C    Last Pain:  Vitals:   06/04/16 0606  TempSrc: Tympanic  PainSc: 4          Complications: No apparent anesthesia complications

## 2016-06-04 NOTE — Op Note (Signed)
OPERATIVE NOTE:  Brittney Chavez PROCEDURE DATE: 06/04/2016   PREOPERATIVE DIAGNOSIS:  1. Positive ECC for mild dysplasia 2. History of high-grade dysplasia 3. 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 LEEP cone biopsy of cervix 2. Wide local excision of vulvar lesion   SURGEON:  Brayton Mars, MD ASSISTANTS: none ANESTHESIA: General INDICATIONS: 55 y.o. [id number] presents for LEEP cone biopsy of the cervix for evaluation and management of endocervical dysplasia identified on colposcopic directed biopsies as well as a vulvar lesion notable for VIN 2-3 on punch biopsy.  FINDINGS:   1. Normal staining of the cervix and vagina with Lugol's solution 2. No significant abnormal staining of the right labia minora biopsy site was previously demonstrated VIN 2-3 using acetic acid solution   I/O's: Total I/O In: 450 [I.V.:450] Out: 100 [Urine:100] COUNTS:  YES SPECIMENS:  1. Ectocervical LEEP-2 fragments 2. Endocervical LEEP-2 fragments 3. Post LEEP ECC 4. Vulvar biopsy wide local excision.  ANTIBIOTIC PROPHYLAXIS:N/A COMPLICATIONS: None immediate  PROCEDURE IN DETAIL: Patient was brought to the operating room and placed in supine position. General anesthesia with LMA technique is induced without difficulty. She is placed in the dorsal lithotomy position using candycane stirrups. No prep was performed because acetic acid solution Lugol's solution was used in determining biopsy parameters. Timeout was completed. A coated speculum was placed in the vagina. The vagina is painted with Lugol's solution revealing a normal staining pattern. 10 cc of lidocaine 1% with 1-100,000 epinephrine is injected circumferentially into the cervix to aid in hemostasis. A wide loop was used in a standard fashion to remove the ectocervical LEEP from the operative field; 2 fragments were removed) a 9:00/12:00/3:00 fragments; a 3:00/6:00/9:00 fragment).  A narrower  loop was then utilized to remove the endocervical LEEP specimen-in 2 fragments. Post LEEP ECC was performed with a serrated curette. Rollerball cautery was used to fulgurate the cone bed in order to optimize hemostasis. Astringent was applied to maximize hemostasis. The wide local excision was then performed on the right labia minora. Acetic acid solution was placed on the vulva to help identify the abnormal lesion. No significant acetowhite staining could be identified and therefore a 3 x 2 cm margin of tissue was taken at the previous biopsy site to be sure that all margins of the VIN 2-3 lesion are removed. A 15 blade was used to cut through the vaginal mucosa. Needle tipped Bovie cautery was then used to excise the specimen. Bovie cautery was used to help facilitate hemostasis. The lesion was then closed in 2 layers using 3-0 Vicryl suture. First layer was a running locking stitch of the submucosa. The second layer was a  3-0 Vicryl placed in a simple interrupted technique.  Brittney Chavez A. Zipporah Plants, MD, ACO[facility]

## 2016-06-04 NOTE — Interval H&P Note (Signed)
History and Physical Interval Note:  06/04/2016 7:27 AM  Brittney Chavez  has presented today for surgery, with the diagnosis of VULVAR LESION, LEUKOPLAKIA OF VULVA, DYSPLASIA OF CERVIX  The various methods of treatment have been discussed with the patient and family. After consideration of risks, benefits and other options for treatment, the patient has consented to  Procedure(s): LOOP ELECTROSURGICAL EXCISION PROCEDURE (LEEP) (N/A) AND VULVAAR BIOPSY as a surgical intervention .  The patient's history has been reviewed, patient examined, no change in status, stable for surgery.  I have reviewed the patient's chart and labs.  Questions were answered to the patient's satisfaction.     Hassell Done A Anatole Apollo

## 2016-06-06 ENCOUNTER — Encounter: Payer: Self-pay | Admitting: Obstetrics and Gynecology

## 2016-06-07 LAB — SURGICAL PATHOLOGY

## 2016-06-11 ENCOUNTER — Telehealth: Payer: Self-pay | Admitting: Obstetrics and Gynecology

## 2016-06-11 NOTE — Telephone Encounter (Signed)
PT CALLED AND LEFT OFFICE VOICE MAIL STATING SHE HAS AN APPT ON Wednesday FOR HER POST OP, SHE IS STILL BLEEDING AND STATED SHE HAS SOMETHING GOING ON DOWN THERE, SOME PAIN, SHE DID NOT SEE WHERE HER PATHOLOGY REPORT IS BACK, SHE DIDN'T KNOW IF SHE NEEDED TO BE SEEN SOONER OR IF IT WAS OK TO WHAT UNTIL WEDNESDAY. PT STATED SHE WOULD  LIKE A CALL BACK.

## 2016-06-11 NOTE — Telephone Encounter (Signed)
N/a. No option to leave VM.

## 2016-06-12 NOTE — Telephone Encounter (Signed)
Pt is s/p leep and WLE x 1 week. She c/o of pain when urine touches her incision site. She is using neosporin and a&d ointment. Erx lidocaine last visit but pt could not afford it. Out of pain meds. Taking Ibup 800q h. Advised tylenol q 4h. No fevers. Pt will f/u with mad on 12/20.

## 2016-06-13 ENCOUNTER — Encounter: Payer: Self-pay | Admitting: Obstetrics and Gynecology

## 2016-06-13 ENCOUNTER — Ambulatory Visit (INDEPENDENT_AMBULATORY_CARE_PROVIDER_SITE_OTHER): Payer: Self-pay | Admitting: Obstetrics and Gynecology

## 2016-06-13 VITALS — BP 147/74 | HR 82 | Wt 201.1 lb

## 2016-06-13 DIAGNOSIS — N879 Dysplasia of cervix uteri, unspecified: Secondary | ICD-10-CM

## 2016-06-13 DIAGNOSIS — R102 Pelvic and perineal pain: Secondary | ICD-10-CM

## 2016-06-13 DIAGNOSIS — Z09 Encounter for follow-up examination after completed treatment for conditions other than malignant neoplasm: Secondary | ICD-10-CM

## 2016-06-13 DIAGNOSIS — N901 Moderate vulvar dysplasia: Secondary | ICD-10-CM

## 2016-06-13 MED ORDER — LIDOCAINE 5 % EX OINT: 1.0000 "application " | TOPICAL_OINTMENT | CUTANEOUS | 0 refills | Status: DC | PRN

## 2016-06-13 NOTE — Patient Instructions (Signed)
1. Continue with pain medication as written 2. Sitz bath's twice a day. 3. Consider voiding in warm water bath to decrease pain at vulvar excision site 4. Recommend topical lidocaine gel to vulvar excision site; let us know what is covered through medication management services 5. Return in [place] week for follow-up

## 2016-06-13 NOTE — Addendum Note (Signed)
Addended by: Raliegh Ip on: 06/13/2016 02:08 PM   Modules accepted: Orders

## 2016-06-13 NOTE — Progress Notes (Signed)
Chief complaint: 1. Postop check 2. Status post LEEP cone biopsy 3. Status post wide local excision vulvar biopsy   PATHOLOGY: LEEP cone biopsy-DIAGNOSIS:  A. ENDOCERVIX, SAMPLED POST LEEP; CURRETTINGS:  - SCANT DETACHED STRIPS OF DYSPLASTIC SQUAMOUS EPITHELIUM THAT CANNOT  BE RELIABLY GRADED.   B. ECTOCERVIX; LEEP:  - ECTOCERVICAL SQUAMOUS MUCOSA WITH LOW GRADE SQUAMOUS INTRA EPITHELIAL  LESION (LSIL / CIN 1) INVOLVING ALL QUADRANTS.  - DYSPLASIA EXTENDS TO BOTH INKED MARGINS IN THE 6:00 SPECIMEN (SHORT  STITCH).   C. ENDOCERVIX; EXCISION:  - LOW GRADE SQUAMOUS INTRA EPITHELIAL LESION (LSIL / CIN1).  - DYSPLASIA FOCALLY INVOLVES THE ECTOCERVICAL SQUAMOUS MARGIN.  - SQUAMOUS METAPLASIA EXTENDS TO THE ENDOCERVICAL MARGIN.  - TRANSFORMATION ZONE PRESENT, NOT ENTIRELY EXCISED.   D. LABIA, RIGHT; WIDE EXCISION:  - FOCAL HIGH GRADE SQUAMOUS INTRA EPITHELIAL LESION (HSIL / VIN2) IN A  BACKGROUND OF EXTENSIVE LOW GRADE SQUAMOUS INTRA EPITHELIAL LESION (LSIL  / VIN1.).  - LOW GRADE DYSPLASIA INVOLVES ALL MARGINS OF THE SPECIMEN.    Patient is experiencing severe vulvar discomfort at wide local excision site. She is exquisitely tender when voiding at the wide local excision site. She reports no fever chills or sweats.  OBJECTIVE: BP (!) 147/74   Pulse 82   Wt 201 lb 1 oz (91.2 kg)   LMP 03/07/2012 (Approximate)   BMI 33.46 kg/m  Uncomfortable white female lying on her side because of pelvic discomfort Pelvic exam: Right labia minora wide local excision site with yellow fibrinous debris overlying surgical site, suture separation noted; exquisitely tender. Speculum exam unable to be done because of pelvic discomfort  ASSESSMENT: 1. Wide local excision of vulvar lesion; pathology notable for focal VIN 2 excised in a background of VIN 1 with positive margins 2. Wide local excision site with suture separation 3. LEEP cone biopsy with extensive CIN-1 involving ectocervical margins;  post LEEP ECC is positive for ungradeable  Dysplasia  PLAN: 1. Local wound care discussed including sitz bath's, voiding in warm tub bath, applying lidocaine gel and Desitin ointment to help with discomfort 2. Return in 2 weeks for follow-up 3. Patient may require hysterectomy for management of persistent dysplasia  Brayton Mars, MD  Note: This dictation was prepared with Dragon dictation along with smaller phrase technology. Any transcriptional errors that result from this process are unintentional.

## 2016-06-14 ENCOUNTER — Telehealth: Payer: Self-pay | Admitting: Obstetrics and Gynecology

## 2016-06-14 NOTE — Telephone Encounter (Signed)
Patient called requesting enough pain meds to get her through the holidays. Thanks

## 2016-06-15 ENCOUNTER — Encounter: Payer: Self-pay | Admitting: Obstetrics and Gynecology

## 2016-06-15 MED ORDER — OXYCODONE-ACETAMINOPHEN 5-325 MG PO TABS: 2.0000 | ORAL_TABLET | ORAL | 0 refills | Status: DC | PRN

## 2016-06-15 NOTE — Telephone Encounter (Signed)
Pt aware. Pts mom will pick up rx. Left rx up front.

## 2016-06-15 NOTE — Progress Notes (Signed)
Patient had CIN I results from LEEP.  States Dr.  Enzo Bi recommends hysterectomy.  CIN I results do no qualify for BCCM.  Given Patient [employer] assistance application.  Copy to [employer].

## 2016-06-21 ENCOUNTER — Telehealth: Payer: Self-pay

## 2016-06-21 NOTE — Telephone Encounter (Signed)
Called pt and gave chaplins results on her back xrays. PT verbalized understanding.

## 2016-06-27 ENCOUNTER — Other Ambulatory Visit: Payer: Self-pay

## 2016-06-27 ENCOUNTER — Ambulatory Visit (INDEPENDENT_AMBULATORY_CARE_PROVIDER_SITE_OTHER): Payer: Self-pay | Admitting: Obstetrics and Gynecology

## 2016-06-27 ENCOUNTER — Encounter: Payer: Self-pay | Admitting: Obstetrics and Gynecology

## 2016-06-27 VITALS — BP 119/81 | HR 74 | Ht 65.0 in | Wt 203.8 lb

## 2016-06-27 DIAGNOSIS — N901 Moderate vulvar dysplasia: Secondary | ICD-10-CM

## 2016-06-27 DIAGNOSIS — Z09 Encounter for follow-up examination after completed treatment for conditions other than malignant neoplasm: Secondary | ICD-10-CM

## 2016-06-27 DIAGNOSIS — N879 Dysplasia of cervix uteri, unspecified: Secondary | ICD-10-CM

## 2016-06-27 NOTE — Progress Notes (Signed)
Chief complaint: 1. Postop check 2. Status post LEEP cone biopsy and wide local excision of VIN 2  Patient is doing much better since last visit 2 weeks ago. She did go on acyclovir for herpes and just completed a 5 day course. She was concerned that this possibly could've been an outbreak in the postoperative period causing her severe vulvar symptoms. Patient is still experiencing some vaginal discharge.  Pathology from surgery was previously discussed (see last note) PATHOLOGY: LEEP cone biopsy-DIAGNOSIS:  A. ENDOCERVIX, SAMPLED POST LEEP; CURRETTINGS:  - SCANT DETACHED STRIPS OF DYSPLASTIC SQUAMOUS EPITHELIUM THAT CANNOT  BE RELIABLY GRADED.   B. ECTOCERVIX; LEEP:  - ECTOCERVICAL SQUAMOUS MUCOSA WITH LOW GRADE SQUAMOUS INTRA EPITHELIAL  LESION (LSIL / CIN 1) INVOLVING ALL QUADRANTS.  - DYSPLASIA EXTENDS TO BOTH INKED MARGINS IN THE 6:00 SPECIMEN (SHORT  STITCH).   C. ENDOCERVIX; EXCISION:  - LOW GRADE SQUAMOUS INTRA EPITHELIAL LESION (LSIL / CIN1).  - DYSPLASIA FOCALLY INVOLVES THE ECTOCERVICAL SQUAMOUS MARGIN.  - SQUAMOUS METAPLASIA EXTENDS TO THE ENDOCERVICAL MARGIN.  - TRANSFORMATION ZONE PRESENT, NOT ENTIRELY EXCISED.   D. LABIA, RIGHT; WIDE EXCISION:  - FOCAL HIGH GRADE SQUAMOUS INTRA EPITHELIAL LESION (HSIL / VIN2) IN A  BACKGROUND OF EXTENSIVE LOW GRADE SQUAMOUS INTRA EPITHELIAL LESION (LSIL  / VIN1.).  - LOW GRADE DYSPLASIA INVOLVES ALL MARGINS OF THE SPECIMEN.   OBJECTIVE: BP 119/81   Pulse 74   Ht 5\' 5"  (1.651 m)   Wt 203 lb 12.8 oz (92.4 kg)   LMP 03/07/2012 (Approximate)   BMI 33.91 kg/m  Patient sitting on the exam table, more comfortable today. Pelvic: External genitalia-right labia majora wide local excision site is granulating in; residual 2 sutures are still noted. It appears that the 2 layered closure was disrupted in the early postoperative period and had to granulate in. BUS-normal Vagina-deferred Cervix-deferred  ASSESSMENT: 1. Postop check,  status post LEEP cone biopsy and wide local excision of VIN 2 2. Wide local excision site is granulating in nicely at this time without evidence of infection 3. LEEP cone biopsy site not examined today the patient's continued discomfort; pathology notable for positive post LEEP ECC with curettings not gradeable  PLAN: 1. Return in 2 weeks for postop follow-up 2. Patient is a applying for charity care at Hospital to possibly have hysterectomy coverage 3. Patient is to return in 6 months for vulvar colposcopy  Brayton Mars, MD  Note: This dictation was prepared with Dragon dictation along with smaller phrase technology. Any transcriptional errors that result from this process are unintentional.

## 2016-06-27 NOTE — Patient Instructions (Signed)
1. Return in 2 weeks for postop check 2. Return in 6 months for vulvar colposcopy

## 2016-06-28 ENCOUNTER — Other Ambulatory Visit: Payer: Self-pay

## 2016-07-04 ENCOUNTER — Telehealth: Payer: Self-pay | Admitting: Pharmacist

## 2016-07-04 NOTE — Telephone Encounter (Signed)
ProAir refill PAP submitted to manufacturer today.

## 2016-07-05 ENCOUNTER — Other Ambulatory Visit: Payer: Self-pay

## 2016-07-05 DIAGNOSIS — R768 Other specified abnormal immunological findings in serum: Secondary | ICD-10-CM

## 2016-07-10 LAB — HEPATITIS C GENOTYPE

## 2016-07-12 ENCOUNTER — Encounter: Payer: Self-pay | Admitting: Obstetrics and Gynecology

## 2016-07-16 ENCOUNTER — Encounter: Payer: Self-pay | Admitting: Obstetrics and Gynecology

## 2016-07-16 ENCOUNTER — Ambulatory Visit (INDEPENDENT_AMBULATORY_CARE_PROVIDER_SITE_OTHER): Payer: Self-pay | Admitting: Obstetrics and Gynecology

## 2016-07-16 VITALS — BP 164/84 | HR 85 | Ht 65.0 in | Wt 204.1 lb

## 2016-07-16 DIAGNOSIS — N901 Moderate vulvar dysplasia: Secondary | ICD-10-CM

## 2016-07-16 DIAGNOSIS — Z09 Encounter for follow-up examination after completed treatment for conditions other than malignant neoplasm: Secondary | ICD-10-CM

## 2016-07-16 DIAGNOSIS — N879 Dysplasia of cervix uteri, unspecified: Secondary | ICD-10-CM

## 2016-07-16 NOTE — Progress Notes (Signed)
Pt is here for a followup after LEEP. Denies discharge/bleeding.

## 2016-07-16 NOTE — Progress Notes (Signed)
HPI:      Ms. Brittney Chavez is a 56 y.o. [id number] who LMP was Patient's last menstrual period was 03/07/2012 (approximate)..  Subjective: She presents today partially 6 weeks after her LEEP and right labial biopsy/excision. She is doing much better today. She says the pain that she had at her last visit has resolved. She reports no vaginal bleeding.     Hx: The following portions of the patient's history were reviewed and updated as appropriate:            She  has a past medical history of Abnormal Pap smear of cervix; Anxiety; Arthritis; Cancer ([place]) (1988); DDD (degenerative disc disease), lumbar; GERD (gastroesophageal reflux disease); Hepatitis C; Hypothyroidism; PTSD (post-traumatic stress disorder); Scoliosis; Thyroid disease; and Torn ACL (anterior cruciate ligament). She  does not have any pertinent problems on file. She  has a past surgical history that includes Thyroidectomy, partial (Bilateral); Tubal ligation; Neck surgery; Cervix lesion destruction; Cervical cone biopsy; Dilation and curettage of uterus; Tonsillectomy; LEEP (N/A, 06/04/2016); and Cervical biopsy w/ loop electrode excision. She has a current medication list which includes the following prescription(s): acetaminophen, albuterol, fexofenadine hcl, gabapentin, levothyroxine, omeprazole, and valacyclovir. Current Outpatient Prescriptions on File Prior to Visit  Medication Sig Dispense Refill  . acetaminophen (TYLENOL) 500 MG tablet Take 500 mg by mouth every 6 (six) hours as needed for mild pain.     Marland Kitchen albuterol (PROVENTIL HFA;VENTOLIN HFA) 108 (90 Base) MCG/ACT inhaler Inhale 2 puffs into the lungs every 6 (six) hours as needed for wheezing or shortness of breath.     . Fexofenadine HCl (ALLERGY 24-HR PO) Take by mouth.    . gabapentin (NEURONTIN) 600 MG tablet Take 600 mg by mouth 3 (three) times daily.     Marland Kitchen levothyroxine (SYNTHROID, LEVOTHROID) 100 MCG tablet Take 100 mcg by mouth daily before breakfast.     .  omeprazole (PRILOSEC) 40 MG capsule Take 40 mg by mouth 2 (two) times daily.     . valACYclovir (VALTREX) 1000 MG tablet Take 1,000 mg by mouth daily as needed. FLARE UPS     No current facility-administered medications on file prior to visit.           ROS: Constitutional: Denied constitutional symptoms, night sweats, recent illness, fatigue, fever, insomnia and weight loss.  Eyes: Denied eye symptoms, eye pain, photophobia, vision change and visual disturbance.  Ears/Nose/Throat/Neck: Denied ear, nose, throat or neck symptoms, hearing loss, nasal discharge, sinus congestion and sore throat.  Cardiovascular: Denied cardiovascular symptoms, arrhythmia, chest pain/pressure, edema, exercise intolerance, orthopnea and palpitations.  Respiratory: Denied pulmonary symptoms, asthma, pleuritic pain, productive sputum, cough, dyspnea and wheezing.  Gastrointestinal: Denied, gastro-esophageal reflux, melena, nausea and vomiting.  Genitourinary: Denied genitourinary symptoms including symptomatic vaginal discharge, pelvic relaxation issues, and urinary complaints.  Musculoskeletal: Denied musculoskeletal symptoms, stiffness, swelling, muscle weakness and myalgia.  Dermatologic: Denied dermatology symptoms, rash and scar.  Neurologic: Denied neurology symptoms, dizziness, headache, neck pain and syncope.  Psychiatric: Denied psychiatric symptoms, anxiety and depression.  Endocrine: Denied endocrine symptoms including hot flashes and night sweats.     Objective: Vitals:   07/16/16 1338  BP: (!) 164/84  Pulse: 85             Physical examination   Pelvic:   Vulva: Normal appearance.  No lesions. Right labial biopsy area noted but healing well.   Vagina: No lesions or abnormalities noted.  Support: Normal pelvic support.  Urethra No masses tenderness or scarring.  Meatus Normal size without lesions or prolapse.  Cervix: Normal appearance.  Obviously status post postprocedure.  No lesions.   Anus: Normal exam.  No lesions.  Perineum: Normal exam.  No lesions.        Bimanual   Uterus: Normal size.  Non-tender.  Mobile.  AV.  Adnexae: No masses.  Non-tender to palpation.  Cul-de-sac: Negative for abnormality.     Assessment: Vulvar intraepithelial neoplasia (VIN) grade 2  Dysplasia of cervix  Postop check   Patient doing well.   Plan:       Meds Outpatient Encounter Prescriptions as of 07/16/2016  Medication Sig Note  . acetaminophen (TYLENOL) 500 MG tablet Take 500 mg by mouth every 6 (six) hours as needed for mild pain.    Marland Kitchen albuterol (PROVENTIL HFA;VENTOLIN HFA) 108 (90 Base) MCG/ACT inhaler Inhale 2 puffs into the lungs every 6 (six) hours as needed for wheezing or shortness of breath.    . Fexofenadine HCl (ALLERGY 24-HR PO) Take by mouth.   . gabapentin (NEURONTIN) 600 MG tablet Take 600 mg by mouth 3 (three) times daily.  03/21/2016: Received from: [facility]: Take 600 mg by mouth Three (3) times a day.  . levothyroxine (SYNTHROID, LEVOTHROID) 100 MCG tablet Take 100 mcg by mouth daily before breakfast.  03/21/2016: Received from: [facility]: Take 100 mcg by mouth daily at 0600.  Marland Kitchen omeprazole (PRILOSEC) 40 MG capsule Take 40 mg by mouth 2 (two) times daily.    . valACYclovir (VALTREX) 1000 MG tablet Take 1,000 mg by mouth daily as needed. FLARE [employer]    No facility-administered encounter medications on file as of 07/16/2016.          Orders No orders of the defined types were placed in this encounter.   1.  We have discussed the pathology findings from her biopsies. We have specifically discussed positive margins and how these are often negative after LEEP because of the thermal damage. Different management schemes regarding Pap smear and vulvar colposcopy follow-up discussed. Patient to review this with Dr. Keturah Barre at her next visit and decide on future screening of vulva and cervix.        F/U  Return in about 3 months (around  10/14/2016).  Finis Bud, M.D. 07/16/2016 2:54 PM

## 2016-08-18 ENCOUNTER — Emergency Department
Admission: EM | Admit: 2016-08-18 | Discharge: 2016-08-18 | Disposition: A | Discharge status: Home / Self Care | Payer: PRIVATE HEALTH INSURANCE | Attending: Emergency Medicine | Admitting: Emergency Medicine

## 2016-08-18 ENCOUNTER — Encounter: Payer: Self-pay | Admitting: Emergency Medicine

## 2016-08-18 DIAGNOSIS — Z79899 Other long term (current) drug therapy: Secondary | ICD-10-CM | POA: Insufficient documentation

## 2016-08-18 DIAGNOSIS — E039 Hypothyroidism, unspecified: Secondary | ICD-10-CM | POA: Insufficient documentation

## 2016-08-18 DIAGNOSIS — Z8541 Personal history of malignant neoplasm of cervix uteri: Secondary | ICD-10-CM | POA: Insufficient documentation

## 2016-08-18 DIAGNOSIS — Y999 Unspecified external cause status: Secondary | ICD-10-CM | POA: Insufficient documentation

## 2016-08-18 DIAGNOSIS — S0501XA Injury of conjunctiva and corneal abrasion without foreign body, right eye, initial encounter: Secondary | ICD-10-CM | POA: Insufficient documentation

## 2016-08-18 DIAGNOSIS — Y939 Activity, unspecified: Secondary | ICD-10-CM | POA: Insufficient documentation

## 2016-08-18 DIAGNOSIS — W228XXA Striking against or struck by other objects, initial encounter: Secondary | ICD-10-CM | POA: Insufficient documentation

## 2016-08-18 DIAGNOSIS — Y929 Unspecified place or not applicable: Secondary | ICD-10-CM | POA: Insufficient documentation

## 2016-08-18 DIAGNOSIS — F17210 Nicotine dependence, cigarettes, uncomplicated: Secondary | ICD-10-CM | POA: Insufficient documentation

## 2016-08-18 MED ORDER — FLUORESCEIN SODIUM 1 MG OP STRP
ORAL_STRIP | OPHTHALMIC | Status: AC
  Administered 2016-08-18: 1 via OPHTHALMIC
  Filled 2016-08-18: qty 1

## 2016-08-18 MED ORDER — EYE WASH OPHTH SOLN
1.0000 [drp] | OPHTHALMIC | Status: DC | PRN
Start: 2016-08-18 — End: 2016-08-18
  Administered 2016-08-18: 1 [drp] via OPHTHALMIC

## 2016-08-18 MED ORDER — TETRACAINE HCL 0.5 % OP SOLN
1.0000 [drp] | Freq: Once | OPHTHALMIC | Status: AC
  Administered 2016-08-18: 1 [drp] via OPHTHALMIC

## 2016-08-18 MED ORDER — EYE WASH OPHTH SOLN
OPHTHALMIC | Status: AC
  Administered 2016-08-18: 1 [drp] via OPHTHALMIC
  Filled 2016-08-18: qty 118

## 2016-08-18 MED ORDER — FLUORESCEIN SODIUM 0.6 MG OP STRP
1.0000 | ORAL_STRIP | Freq: Once | OPHTHALMIC | Status: AC
  Administered 2016-08-18: 1 via OPHTHALMIC

## 2016-08-18 MED ORDER — GENTAMICIN SULFATE 0.3 % OP SOLN: 2.0000 [drp] | OPHTHALMIC | 0 refills | Status: DC

## 2016-08-18 MED ORDER — TETRACAINE HCL 0.5 % OP SOLN
OPHTHALMIC | Status: AC
  Administered 2016-08-18: 1 [drp] via OPHTHALMIC
  Filled 2016-08-18: qty 2

## 2016-08-18 NOTE — Discharge Instructions (Signed)
Begin using gentamicin ophthalmic solution to the right eye every 4 hours while awake. Wear sunglasses if exposed to bright light. Call the ophthalmologist on call tomorrow if not improving. It is best that you call first thing in the morning.

## 2016-08-18 NOTE — ED Provider Notes (Signed)
[facility] Emergency Department Provider Note  ____________________________________________   First MD Initiated Contact with Patient 08/18/16 [phone]     (approximate)  I have reviewed the triage vital signs and the nursing notes.   HISTORY  Chief Complaint Eye Pain    HPI Brittney Chavez is a 56 y.o. female is here with complaint of right eye pain and drainage. Patient states that for approximately 2 nights she has felt like there is a foreign body in her eye. She is uncertain as to whether she has scratched her eye during her sleep. She does admit to rubbing her eye frequently because of the pain. She also has used some eye drops over-the-counter to try and flush her eye. She states that her vision is "blurry"in comparison to the left eye. She states it is been clear discharge from her eyes only. She denies any seasonal allergy symptoms. Patient states that she is photophobic.   Past Medical History:  Diagnosis Date  . Abnormal Pap smear of cervix   . Anxiety   . Arthritis   . 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 DDD (degenerative disc disease), lumbar   . GERD (gastroesophageal reflux disease)   . Hepatitis C   . Hypothyroidism   . PTSD (post-traumatic stress disorder)   . Scoliosis   . Thyroid disease   . Torn ACL (anterior cruciate ligament)    Left Knee    Patient Active Problem List   Diagnosis Date Noted  . 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 Dysplasia of cervix 05/02/2016  . ASCUS with positive high risk HPV cervical 04/17/2016  . 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 Tobacco user 04/17/2016  . Vulvar lesion 04/17/2016  . Scoliosis 03/21/2016  . History of underactive thyroid 03/21/2016  . Dysphagia 03/21/2016    Past Surgical History:  Procedure Laterality Date  . CERVICAL BIOPSY  W/ LOOP ELECTRODE EXCISION    . CERVICAL CONE BIOPSY    . CERVIX LESION DESTRUCTION    . DILATION AND  CURETTAGE OF UTERUS     x 2  . LEEP N/A 06/04/2016   Procedure: LOOP ELECTROSURGICAL EXCISION PROCEDURE (LEEP);  Surgeon: Brayton Mars, MD;  Location: [facility] ORS;  Service: Gynecology;  Laterality: N/A;  . NECK SURGERY     Cervical Fusion  . THYROIDECTOMY, PARTIAL Bilateral   . TONSILLECTOMY     as a child  . TUBAL LIGATION      Prior to Admission medications   Medication Sig Start Date End Date Taking? Authorizing Provider  acetaminophen (TYLENOL) 500 MG tablet Take 500 mg by mouth every 6 (six) hours as needed for mild pain.     Historical Provider, MD  albuterol (PROVENTIL HFA;VENTOLIN HFA) 108 (90 Base) MCG/ACT inhaler Inhale 2 puffs into the lungs every 6 (six) hours as needed for wheezing or shortness of breath.     Historical Provider, MD  Fexofenadine HCl (ALLERGY 24-HR PO) Take by mouth.    Historical Provider, MD  gabapentin (NEURONTIN) 600 MG tablet Take 600 mg by mouth 3 (three) times daily.     Historical Provider, MD  gentamicin (GARAMYCIN) 0.3 % ophthalmic solution Place 2 drops into the right eye every 4 (four) hours. While awake 08/18/16   Johnn Hai, PA-C  levothyroxine (SYNTHROID, LEVOTHROID) 100 MCG tablet Take 100 mcg by mouth daily before breakfast.     Historical Provider, MD  omeprazole (PRILOSEC) 40 MG capsule Take 40  mg by mouth 2 (two) times daily.     Historical Provider, MD  valACYclovir (VALTREX) 1000 MG tablet Take 1,000 mg by mouth daily as needed. FLARE UPS    Historical Provider, MD    Allergies Nsaids and Pollen extract  Family History  Problem Relation Age of Onset  . Cancer Mother   . Diabetes Mother   . COPD Mother   . Breast cancer Mother 11  . COPD Father   . Stroke Father   . Polycystic ovary syndrome Daughter   . Birth defects Maternal Grandmother   . Colon cancer Maternal Grandmother   . Birth defects Paternal Grandmother   . Ovarian cancer Neg Hx   . Heart disease Neg Hx     Social History Social History  Substance  Use Topics  . Smoking status: Current Every Day Smoker    Packs/day: 1.00    Years: 30.00    Types: Cigarettes  . Smokeless tobacco: Current User  . Alcohol use No    Review of Systems Constitutional: No fever/chills Eyes: Positive right eye pain. Positive clear drainage. ENT: No sore throat. Cardiovascular: Denies chest pain. Respiratory: Denies shortness of breath. Gastrointestinal: No abdominal pain.  No nausea, no vomiting.   Skin: Negative for rash. Neurological: Negative for headaches, focal weakness or numbness.  10-point ROS otherwise negative.  ____________________________________________   PHYSICAL EXAM:  VITAL SIGNS: ED Triage Vitals  Enc Vitals Group     BP 08/18/16 0919 (!) 140/101     Pulse Rate 08/18/16 0919 86     Resp 08/18/16 0919 16     Temp 08/18/16 0919 97.5 F (36.4 C)     Temp Source 08/18/16 0919 Oral     SpO2 08/18/16 0919 98 %     Weight 08/18/16 0913 200 lb (90.7 kg)     Height 08/18/16 0913 5\' 4"  (1.626 m)     Head Circumference --      Peak Flow --      Pain Score 08/18/16 0913 8     Pain Loc --      Pain Edu? --      Excl. in [place]? --     Constitutional: Alert and oriented. Well appearing and in no acute distress. Eyes: Conjunctivae On the right is slightly injected. There is no purulent drainage noted. There is some tearing present. Patient is photophobic. Left sclera conjunctiva is clear. PERRL. EOMI.  Head: Atraumatic. Nose: No congestion/rhinnorhea. Neck: No stridor.   Hematological/Lymphatic/Immunilogical: No cervical lymphadenopathy. Cardiovascular: Normal rate, regular rhythm. Grossly normal heart sounds.  Good peripheral circulation. Respiratory: Normal respiratory effort.  No retractions. Lungs CTAB. Musculoskeletal: Moves upper and lower extremities without difficulty. Normal gait was noted. Neurologic:  Normal speech and language. No gross focal neurologic deficits are appreciated. No gait instability. Skin:  Skin is warm,  dry and intact. No rash noted. Psychiatric: Mood and affect are normal. Speech and behavior are normal.  ____________________________________________   LABS (all labs ordered are listed, but only abnormal results are displayed)  Labs Reviewed - No data to display   PROCEDURES  Procedure(s) performed: Tetracaine was placed in the right eye. Area was examined without obvious foreign body noted. Lid was inverted and no foreign body was noted. Fluorescein dye was placed. There is pinpoint uptake at approximately 6:00 of the cornea. No foreign body was noted in this area. Attempt with a Q-tip verified that no foreign body was felt. Eye  was irrigated with eyewash solution.  Procedures  Critical Care performed: No  ____________________________________________   INITIAL IMPRESSION / ASSESSMENT AND PLAN / ED COURSE  Pertinent labs & imaging results that were available during my care of the patient were reviewed by me and considered in my medical decision making (see chart for details).  Patient wears glasses.  She is encouraged to follow-up with [facility] on Monday if any continued problems or if any worsening of her symptoms to call the ophthalmologist on call. Patient was given a prescription for gentamicin ophthalmic solution 2 drops every 4 hours while awake. Patient states that she is going to a concert tonight in which she has seats are close  to the stage. She is aware that bright lights will  irritate her eye. She plans to wear sunglasses to the concert because this is her favorite group.      ____________________________________________   FINAL CLINICAL IMPRESSION(S) / ED DIAGNOSES  Final diagnoses:  Abrasion of right cornea, initial encounter      NEW MEDICATIONS STARTED DURING THIS VISIT:  Discharge Medication List as of 08/18/2016 10:10 AM    START taking these medications   Details  gentamicin (GARAMYCIN) 0.3 % ophthalmic solution Place 2 drops into the  right eye every 4 (four) hours. While awake, Starting Sat 08/18/2016, Print         Note:  This document was prepared using Dragon voice recognition software and may include unintentional dictation errors.    Johnn Hai, PA-C 08/18/16 1446    Orbie Pyo, MD 08/18/16 [phone]

## 2016-08-18 NOTE — ED Notes (Signed)
Pt alert and oriented X4, active, cooperative, pt in NAD. RR even and unlabored, color WNL.  Pt informed to return if any life threatening symptoms occur.   

## 2016-08-18 NOTE — ED Triage Notes (Signed)
Pt to ed with c/o right eye pain and drainage.  Pt states it feels like there is a foreign body in her eye.

## 2016-08-18 NOTE — ED Notes (Signed)
ED Provider at bedside. 

## 2016-08-18 NOTE — ED Notes (Addendum)
L eye 20/40, R eye 20/200.   Pt unsure what she scratched right eye with, swollen and clear discharge from eye. Pain to right eye and blurry vision.

## 2016-08-22 ENCOUNTER — Ambulatory Visit: Payer: Self-pay | Admitting: Internal Medicine

## 2016-09-19 ENCOUNTER — Telehealth: Payer: Self-pay | Admitting: Pharmacist

## 2016-09-19 NOTE — Telephone Encounter (Signed)
Called [facility] spoke with Hassan Rowan to refill ProAir, allow 5-7 days to receive, and 1 refill left.

## 2016-10-04 ENCOUNTER — Ambulatory Visit: Payer: Self-pay | Admitting: Urology

## 2016-10-04 VITALS — BP 155/95 | HR 75 | Temp 98.0°F | Wt 204.6 lb

## 2016-10-04 DIAGNOSIS — J40 Bronchitis, not specified as acute or chronic: Secondary | ICD-10-CM

## 2016-10-04 MED ORDER — AZITHROMYCIN 250 MG PO TABS: ORAL_TABLET | ORAL | 0 refills | Status: DC

## 2016-10-04 NOTE — Progress Notes (Signed)
  Patient: Brittney Chavez Female    DOB: [birth year]   56 y.o.   MRN: [id number] Visit Date: 10/04/2016  Today's Provider: Zara Council, PA-C   Chief Complaint  Patient presents with  . Hypertension  . Tinnitus  . Chest Pain  . Shortness of Breath  . Headache   Subjective:    HPI 56 yo WF with multiple complaints, HTN, tinnitus, chest pain, SOB and HA.    HA occurring the last month.  Sounds like water is rushing in her head when she stands up.  She is also experiencing a tightness in her chest.  No cough. Rhinorrhea.  She is having nausea.    Patient has had a need to use her albuterol inhaler twice daily over the last two weeks.  Her normal use is one time three nights a week.     Allergies  Allergen Reactions  . Nsaids Nausea Only    Other reaction(s): OTHER  . Pollen Extract    Previous Medications   ACETAMINOPHEN (TYLENOL) 500 MG TABLET    Take 500 mg by mouth every 6 (six) hours as needed for mild pain.    ALBUTEROL (PROVENTIL HFA;VENTOLIN HFA) 108 (90 BASE) MCG/ACT INHALER    Inhale 2 puffs into the lungs every 6 (six) hours as needed for wheezing or shortness of breath.    FEXOFENADINE HCL (ALLERGY 24-HR PO)    Take by mouth.   GABAPENTIN (NEURONTIN) 600 MG TABLET    Take 600 mg by mouth 3 (three) times daily.    GENTAMICIN (GARAMYCIN) 0.3 % OPHTHALMIC SOLUTION    Place 2 drops into the right eye every 4 (four) hours. While awake   LEVOTHYROXINE (SYNTHROID, LEVOTHROID) 100 MCG TABLET    Take 100 mcg by mouth daily before breakfast.    OMEPRAZOLE (PRILOSEC) 40 MG CAPSULE    Take 40 mg by mouth 2 (two) times daily.    VALACYCLOVIR (VALTREX) 1000 MG TABLET    Take 1,000 mg by mouth daily as needed. FLARE UPS    Review of Systems  Social History  Substance Use Topics  . Smoking status: Current Every Day Smoker    Packs/day: 1.00    Years: 30.00    Types: Cigarettes  . Smokeless tobacco: Current User  . Alcohol use No   Objective:   BP (!) 155/95   Pulse 75    Temp 98 F (36.7 C)   Wt 204 lb 9.6 oz (92.8 kg)   LMP 03/07/2012 (Approximate)   BMI 35.12 kg/m   Physical Exam Constitutional: Well nourished. Alert and oriented, No acute distress. HEENT: [place] AT, moist mucus membranes. Trachea midline, no masses. Cardiovascular: No clubbing, cyanosis, or edema. Respiratory: Normal respiratory effort, no increased work of breathing. Decreased breath sounds Skin: No rashes, bruises or suspicious lesions. Lymph: No cervical or inguinal adenopathy. Neurologic: Grossly intact, no focal deficits, moving all 4 extremities. Psychiatric: Normal mood and affect.      Assessment & Plan:     1. Bronchitis  - start Z-pak  - RTC in two weeks  - review red flags        Zara Council, PA-C   [facility]

## 2016-10-10 ENCOUNTER — Other Ambulatory Visit: Payer: Self-pay

## 2016-10-16 ENCOUNTER — Encounter: Payer: Self-pay | Admitting: Obstetrics and Gynecology

## 2016-10-16 ENCOUNTER — Ambulatory Visit (INDEPENDENT_AMBULATORY_CARE_PROVIDER_SITE_OTHER): Payer: PRIVATE HEALTH INSURANCE | Admitting: Obstetrics and Gynecology

## 2016-10-16 VITALS — BP 166/91 | HR 85 | Ht 64.0 in | Wt 200.6 lb

## 2016-10-16 DIAGNOSIS — R1032 Left lower quadrant pain: Secondary | ICD-10-CM

## 2016-10-16 DIAGNOSIS — N879 Dysplasia of cervix uteri, unspecified: Secondary | ICD-10-CM | POA: Diagnosis not present

## 2016-10-16 DIAGNOSIS — N901 Moderate vulvar dysplasia: Secondary | ICD-10-CM

## 2016-10-16 NOTE — Patient Instructions (Addendum)
1. Pelvic ultrasound is ordered 2. Results from ultrasound will be made available 3. Return in July for vulvar and cervical colposcopy as scheduled

## 2016-10-16 NOTE — Progress Notes (Signed)
GYN ENCOUNTER NOTE  Subjective:       Brittney Chavez is a 56 y.o. [phone] female is here for gynecologic evaluation of the following issues:  1. Cervical dysplasia 2. Vulvar dysplasia 3. Left lower quadrant pain  CERVICAL DYSPLASIA-at LEEP cone biopsy patient had a positive ECC for possible residual dysplasia. She is scheduled for colposcopy in July. 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 No change in bowel function. Patient is not experiencing any vaginal bleeding or vaginal discharge. Patient recently underwent colonoscopy and EGD with findings of Barrett's esophagus and also a lesion in her colon-adenomatous polyp in cecum and adenomatous polyp in transverse CO L ON.  Obstetric History OB History  Gravida Para Term Preterm AB Living  5 2 2   3 2   SAB TAB Ectopic Multiple Live Births  3       2    # Outcome Date GA Lbr Len/2nd Weight Sex Delivery Anes PTL Lv  5 Term 1988   7 lb 2.1 oz (3.234 kg)  Vag-Spont   LIV  4 Term 1985   7 lb 1.8 oz (3.225 kg)  Vag-Spont   LIV  3 SAB           2 SAB           1 SAB               Past Medical History:  Diagnosis Date  . Abnormal Pap smear of cervix   . Anxiety   . Arthritis   . Barrett esophagus   . 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 Colon polyp    unc  . DDD (degenerative disc disease), lumbar   . GERD (gastroesophageal reflux disease)   . Hepatitis C   . Hypothyroidism   . PTSD (post-traumatic stress disorder)   . Scoliosis   . Thyroid disease   . Torn ACL (anterior cruciate ligament)    Left Knee    Past Surgical History:  Procedure Laterality Date  . CERVICAL BIOPSY  W/ LOOP ELECTRODE EXCISION    .  CERVICAL CONE BIOPSY    . CERVIX LESION DESTRUCTION    . DILATION AND CURETTAGE OF UTERUS     x 2  . LEEP N/A 06/04/2016   Procedure: LOOP ELECTROSURGICAL EXCISION PROCEDURE (LEEP);  Surgeon: Brayton Mars, MD;  Location: [facility] ORS;  Service: Gynecology;  Laterality: N/A;  . NECK SURGERY     Cervical Fusion  . THYROIDECTOMY, PARTIAL Bilateral   . TONSILLECTOMY     as a child  . TUBAL LIGATION      Current Outpatient Prescriptions on File Prior to Visit  Medication Sig Dispense Refill  . acetaminophen (TYLENOL) 500 MG tablet Take 500 mg by mouth every 6 (six) hours as needed for mild pain.     Marland Kitchen albuterol (PROVENTIL HFA;VENTOLIN HFA) 108 (90 Base) MCG/ACT inhaler Inhale 2 puffs into the lungs every 6 (six) hours as needed for wheezing or shortness of breath.     . gabapentin (NEURONTIN) 600 MG tablet Take 600 mg by mouth 3 (three) times daily.     Marland Kitchen gentamicin (GARAMYCIN) 0.3 % ophthalmic solution Place 2 drops into the right eye every 4 (four) hours.  While awake 5 mL 0  . levothyroxine (SYNTHROID, LEVOTHROID) 100 MCG tablet Take 100 mcg by mouth daily before breakfast.     . omeprazole (PRILOSEC) 40 MG capsule Take 40 mg by mouth 2 (two) times daily.     . valACYclovir (VALTREX) 1000 MG tablet Take 1,000 mg by mouth daily as needed. FLARE UPS     No current facility-administered medications on file prior to visit.     Allergies  Allergen Reactions  . Bee Pollen Shortness Of Breath and Swelling  . Nsaids Nausea Only    Other reaction(s): OTHER  . Pollen Extract     Social History   Social History  . Marital status: Single    Spouse name: N/A  . Number of children: N/A  . Years of education: N/A   Occupational History  . Not on file.   Social History Main Topics  . Smoking status: Current Every Day Smoker    Packs/day: 1.00    Years: 30.00    Types: Cigarettes  . Smokeless tobacco: Current User  . Alcohol use No  . Drug use: Yes    Types: Marijuana      Comment: 2 x a week  . Sexual activity: No   Other Topics Concern  . Not on file   Social History Narrative  . No narrative on file    Family History  Problem Relation Age of Onset  . Cancer Mother   . Diabetes Mother   . COPD Mother   . Breast cancer Mother 70  . COPD Father   . Stroke Father   . Polycystic ovary syndrome Daughter   . Birth defects Maternal Grandmother   . Colon cancer Maternal Grandmother   . Birth defects Paternal Grandmother   . Ovarian cancer Neg Hx   . Heart disease Neg Hx     The following portions of the patient's history were reviewed and updated as appropriate: allergies, current medications, past family history, past medical history, past social history, past surgical history and problem list.  Review of Systems Per history of present illness Objective:   BP (!) 166/91   Pulse 85   Ht 5\' 4"  (1.626 m)   Wt 200 lb 9.6 oz (91 kg)   LMP 03/07/2012 (Approximate)   BMI 34.43 kg/m  CONSTITUTIONAL: Well-developed, well-nourished female in no acute distress.  HENT:  Normocephalic, atraumatic.  NECK: Not examined SKIN: Skin is warm and dry. No rash noted. Not diaphoretic. No erythema. No pallor. [place]: Alert and oriented to person, place, and time. PSYCHIATRIC: Normal mood and affect. Normal behavior. Normal judgment and thought content. CARDIOVASCULAR:Not Examined RESPIRATORY: Not Examined BREASTS: Not Examined ABDOMEN: Soft, non distended; Non tender.  No Organomegaly. PELVIC:  External Genitalia: Right labia minora wide local excision biopsy site is healed; there is a 5 mm defect in the mucosa (through and through); no leukoplakia; no epithelial skin breakdown  BUS: Normal  Vagina: Atrophic changes  Cervix: Normal; no lesions seen; no cervical motion tenderness  Uterus: Normal size, shape,consistency, mobile, midplane, nontender  Adnexa: Bilateral tenderness left greater than right 2/4  RV: Normal external exam  Bladder:  Nontender MUSCULOSKELETAL: Normal range of motion. No tenderness.  No cyanosis, clubbing, or edema.     Assessment:   1. Vulvar intraepithelial neoplasia (VIN) grade 2  2. Dysplasia of cervix, positive ECC on LEEP specimen  3. Left lower quadrant pain; adenomatous polyps on: Biopsy at colonoscopy; normal uterus on exam with no palpable adnexal masses -  US Pelvis Complete; Future - US Transvaginal Non-OB; Future     Plan:   1. Ultrasound of pelvis 2. Return in July for vulvar and cervical colposcopy 3. Results from ultrasound will be made available  A total of 15 minutes were spent face-to-face with the patient during this encounter and over half of that time dealt with counseling and coordination of care.  Brayton Mars, MD  Note: This dictation was prepared with Dragon dictation along with smaller phrase technology. Any transcriptional errors that result from this process are unintentional.

## 2016-10-17 ENCOUNTER — Ambulatory Visit: Payer: PRIVATE HEALTH INSURANCE | Admitting: Internal Medicine

## 2016-10-17 ENCOUNTER — Encounter: Payer: Self-pay | Admitting: Internal Medicine

## 2016-10-17 ENCOUNTER — Ambulatory Visit (INDEPENDENT_AMBULATORY_CARE_PROVIDER_SITE_OTHER): Payer: PRIVATE HEALTH INSURANCE

## 2016-10-17 VITALS — BP 140/84 | HR 80 | Temp 97.7°F | Wt 201.0 lb

## 2016-10-17 DIAGNOSIS — R1032 Left lower quadrant pain: Secondary | ICD-10-CM | POA: Diagnosis not present

## 2016-10-17 DIAGNOSIS — I10 Essential (primary) hypertension: Secondary | ICD-10-CM

## 2016-10-17 DIAGNOSIS — I499 Cardiac arrhythmia, unspecified: Secondary | ICD-10-CM

## 2016-10-17 MED ORDER — HYDROCHLOROTHIAZIDE 25 MG PO TABS: 25.0000 mg | ORAL_TABLET | Freq: Every day | ORAL | 3 refills | Status: DC

## 2016-10-17 NOTE — Progress Notes (Signed)
   Subjective:    Patient ID: Brittney Chavez, female    DOB: [birth year], 56 y.o.   MRN: [id number]  HPI Patient Active Problem List   Diagnosis Date Noted  . 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 Dysplasia of cervix 05/02/2016  . ASCUS with positive high risk HPV cervical 04/17/2016  . 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 Tobacco user 04/17/2016  . Vulvar lesion 04/17/2016  . Scoliosis 03/21/2016  . History of underactive thyroid 03/21/2016  . Dysphagia 03/21/2016    Allergies as of 10/17/2016      Reactions   Bee Pollen Shortness Of Breath, Swelling   Nsaids Nausea Only   Other reaction(s): OTHER Blisters in mouth   Pollen Extract       Medication List       Accurate as of 10/17/16  9:29 AM. Always use your most recent med list.          acetaminophen 500 MG tablet Commonly known as:  TYLENOL Take 500 mg by mouth every 6 (six) hours as needed for mild pain.   albuterol 108 (90 Base) MCG/ACT inhaler Commonly known as:  PROVENTIL HFA;VENTOLIN HFA Inhale 2 puffs into the lungs every 6 (six) hours as needed for wheezing or shortness of breath.   cetirizine 10 MG tablet Commonly known as:  ZYRTEC Take 10 mg by mouth daily.   gabapentin 600 MG tablet Commonly known as:  NEURONTIN Take 600 mg by mouth 3 (three) times daily.   gentamicin 0.3 % ophthalmic solution Commonly known as:  GARAMYCIN Place 2 drops into the right eye every 4 (four) hours. While awake   levothyroxine 100 MCG tablet Commonly known as:  SYNTHROID, LEVOTHROID Take 100 mcg by mouth daily before breakfast.   omeprazole 40 MG capsule Commonly known as:  PRILOSEC Take 40 mg by mouth 2 (two) times daily.   valACYclovir 1000 MG tablet Commonly known as:  VALTREX Take 1,000 mg by mouth daily as needed. FLARE UPS      Pt presents with hip pain and back pain. Pt has been dealing with a constant headache x two weeks. Pt has been having palpitations when lying down. PT c/o of  urinating more at night.   Review of Systems     Objective:   Physical Exam  Constitutional: She is oriented to person, place, and time.  Cardiovascular: Normal rate, regular rhythm and normal heart sounds.   Pulmonary/Chest: Effort normal and breath sounds normal.  Neurological: She is alert and oriented to person, place, and time.    BP 140/84   Pulse 80   Temp 97.7 F (36.5 C) (Oral)   Wt 201 lb (91.2 kg)   LMP 03/07/2012 (Approximate)   BMI 34.50 kg/m        Assessment & Plan:  Scalp muscles are tight which could be causing the headache. Heart rate is normal. Blood pressure is 142/94. Ringing of ears is most likely coming from allergies. Needs to swap out allergy meds. Needs to be put on a bp med. Ordered hydrochlorothiazide 25 mg. Refer for EKG. Labs today met c, cbc, tsh, lipid, and sed rate. RTC in 6 weeks.

## 2016-10-18 LAB — COMPREHENSIVE METABOLIC PANEL
A/G RATIO: 1.1 — AB (ref 1.2–2.2)
ALT: 29 IU/L (ref 0–32)
AST: 26 IU/L (ref 0–40)
Albumin: 4.4 g/dL (ref 3.5–5.5)
Alkaline Phosphatase: 86 IU/L (ref 39–117)
BILIRUBIN TOTAL: 0.3 mg/dL (ref 0.0–1.2)
BUN/Creatinine Ratio: 13 (ref 9–23)
BUN: 12 mg/dL (ref 6–24)
CALCIUM: 9.5 mg/dL (ref 8.7–10.2)
CHLORIDE: 99 mmol/L (ref 96–106)
CO2: 27 mmol/L (ref 18–29)
Creatinine, Ser: 0.95 mg/dL (ref 0.57–1.00)
GFR calc Af Amer: 78 mL/min/{1.73_m2} (ref >59)
GFR, EST NON AFRICAN AMERICAN: 68 mL/min/{1.73_m2} (ref >59)
GLOBULIN, TOTAL: 3.9 g/dL (ref 1.5–4.5)
Glucose: 99 mg/dL (ref 65–99)
POTASSIUM: 5 mmol/L (ref 3.5–5.2)
SODIUM: 141 mmol/L (ref 134–144)
Total Protein: 8.3 g/dL (ref 6.0–8.5)

## 2016-10-18 LAB — LIPID PANEL
CHOL/HDL RATIO: 6 ratio — AB (ref 0.0–4.4)
Cholesterol, Total: 215 mg/dL — ABNORMAL HIGH (ref 100–199)
HDL: 36 mg/dL — ABNORMAL LOW (ref >39)
LDL CALC: 128 mg/dL — AB (ref 0–99)
Triglycerides: 255 mg/dL — ABNORMAL HIGH (ref 0–149)
VLDL Cholesterol Cal: 51 mg/dL — ABNORMAL HIGH (ref 5–40)

## 2016-10-18 LAB — CBC WITH DIFFERENTIAL

## 2016-10-18 LAB — SEDIMENTATION RATE

## 2016-10-18 LAB — TSH: TSH: 3.99 u[IU]/mL (ref 0.450–4.500)

## 2016-10-18 NOTE — Addendum Note (Signed)
Addended by: Meda Klinefelter D on: 10/18/2016 08:00 PM   Modules accepted: Orders

## 2016-10-19 LAB — CBC WITH DIFFERENTIAL/PLATELET
Basophils Absolute: 0.1 10*3/uL (ref 0.0–0.2)
Basos: 1 %
EOS (ABSOLUTE): 0.3 10*3/uL (ref 0.0–0.4)
Eos: 3 %
Hematocrit: 45.2 % (ref 34.0–46.6)
Hemoglobin: 14.5 g/dL (ref 11.1–15.9)
IMMATURE GRANS (ABS): 0 10*3/uL (ref 0.0–0.1)
Immature Granulocytes: 0 %
Lymphocytes Absolute: 2.8 10*3/uL (ref 0.7–3.1)
Lymphs: 31 %
MCH: 29.9 pg (ref 26.6–33.0)
MCHC: 32.1 g/dL (ref 31.5–35.7)
MCV: 93 fL (ref 79–97)
Monocytes Absolute: 0.6 10*3/uL (ref 0.1–0.9)
Monocytes: 7 %
NEUTROS PCT: 58 %
Neutrophils Absolute: 5.3 10*3/uL (ref 1.4–7.0)
PLATELETS: 352 10*3/uL (ref 150–379)
RBC: 4.85 x10E6/uL (ref 3.77–5.28)
RDW: 15.2 % (ref 12.3–15.4)
WBC: 9.1 10*3/uL (ref 3.4–10.8)

## 2016-10-19 LAB — SEDIMENTATION RATE: SED RATE: 11 mm/h (ref 0–40)

## 2016-11-28 ENCOUNTER — Other Ambulatory Visit: Payer: PRIVATE HEALTH INSURANCE

## 2016-12-04 ENCOUNTER — Telehealth: Payer: Self-pay | Admitting: Pharmacist

## 2016-12-04 NOTE — Telephone Encounter (Signed)
12/04/16 Faxed request to [facility] for refill on ProAir HFA.

## 2016-12-12 ENCOUNTER — Ambulatory Visit: Payer: PRIVATE HEALTH INSURANCE | Admitting: Internal Medicine

## 2016-12-27 ENCOUNTER — Encounter: Payer: Self-pay | Admitting: Obstetrics and Gynecology

## 2017-01-08 ENCOUNTER — Telehealth: Payer: Self-pay | Admitting: Pharmacy Technician

## 2017-01-08 NOTE — Telephone Encounter (Signed)
Patient eligible to receive medication assistance at [facility] through 2018, as long as eligibility requirements continue to be met.  [facility]

## 2017-02-04 ENCOUNTER — Other Ambulatory Visit: Payer: Self-pay | Admitting: Internal Medicine

## 2017-02-04 DIAGNOSIS — I10 Essential (primary) hypertension: Secondary | ICD-10-CM

## 2017-02-12 ENCOUNTER — Encounter: Payer: Self-pay | Admitting: Obstetrics and Gynecology

## 2017-02-12 ENCOUNTER — Ambulatory Visit (INDEPENDENT_AMBULATORY_CARE_PROVIDER_SITE_OTHER): Payer: PRIVATE HEALTH INSURANCE | Admitting: Obstetrics and Gynecology

## 2017-02-12 VITALS — BP 111/72 | HR 80 | Ht 64.0 in | Wt 196.0 lb

## 2017-02-12 DIAGNOSIS — N879 Dysplasia of cervix uteri, unspecified: Secondary | ICD-10-CM | POA: Diagnosis not present

## 2017-02-12 DIAGNOSIS — D071 Carcinoma in situ of vulva: Secondary | ICD-10-CM

## 2017-02-13 IMAGING — RF DG ESOPHAGUS
8 of 12 series · 12 of 24 positions shown · non-contrast
Comparison: None.

CLINICAL DATA: Pt has been choking when eating and after eating and
feels like it has gotten worse in the last 6 months. She said she
feels like food is hanging in her throat. Hx of reflux.

EXAM:
ESOPHOGRAM / BARIUM SWALLOW / BARIUM TABLET STUDY
TECHNIQUE: Combined double contrast and single contrast examination performed
using effervescent crystals, thick barium liquid, and thin barium
liquid. The patient was observed with fluoroscopy swallowing a 13 mm
barium sulphate tablet.
FLUOROSCOPY TIME:  Fluoroscopy Time:  1 minutes 6 seconds
Radiation Exposure Index (if provided by the fluoroscopic device):
10.4 mGy
Number of Acquired Spot Images: 0

[Series 1: cp_standard · 0.51mm/px · 2 of 12 frames shown (1 of 8)]
[frame 7/12]
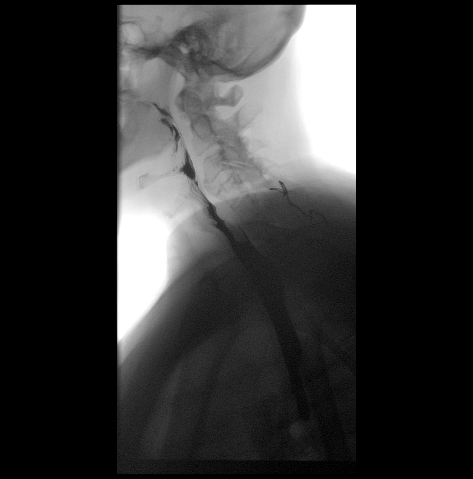
[frame 12/12]
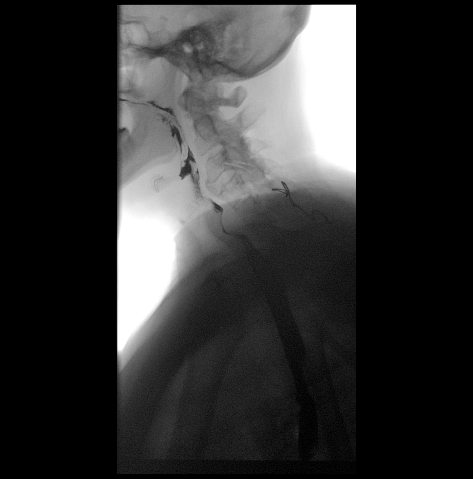

[Series 2: cp_standard · 0.51mm/px · 2 of 42 frames shown (2 of 8)]
[frame 22/42]
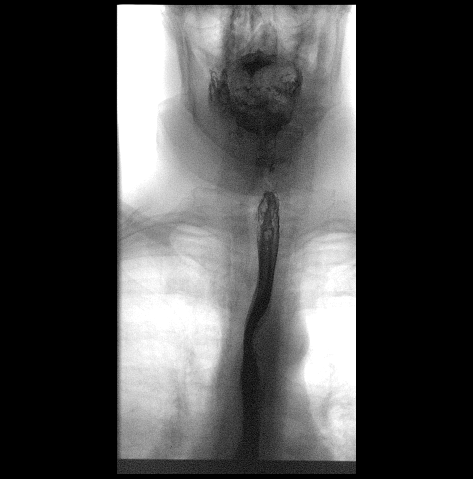
[frame 36/42]
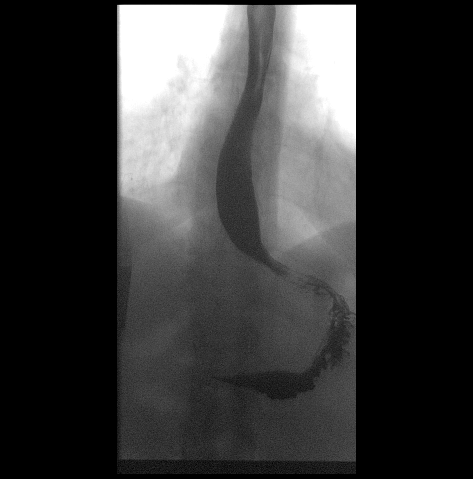

[Series 3: cp_standard · 0.51mm/px · 2 of 62 frames shown (3 of 8)]
[frame 22/62]
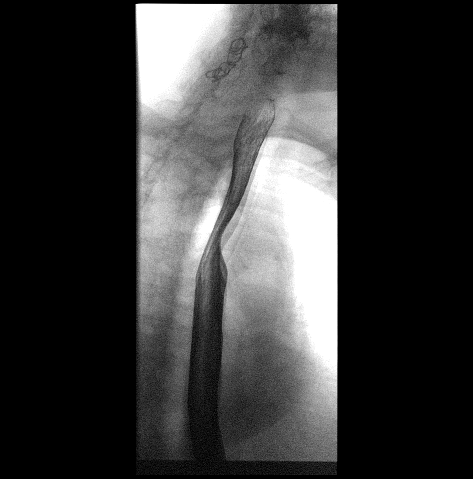
[frame 53/62]
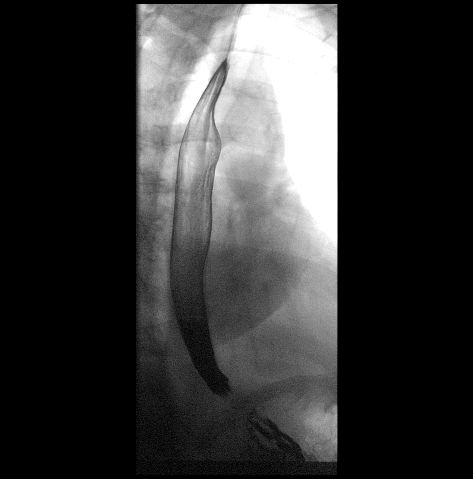

[Series 6: cp_standard · 0.25mm/px · 1 of 1 slices shown (4 of 8)]
[im 1/1]
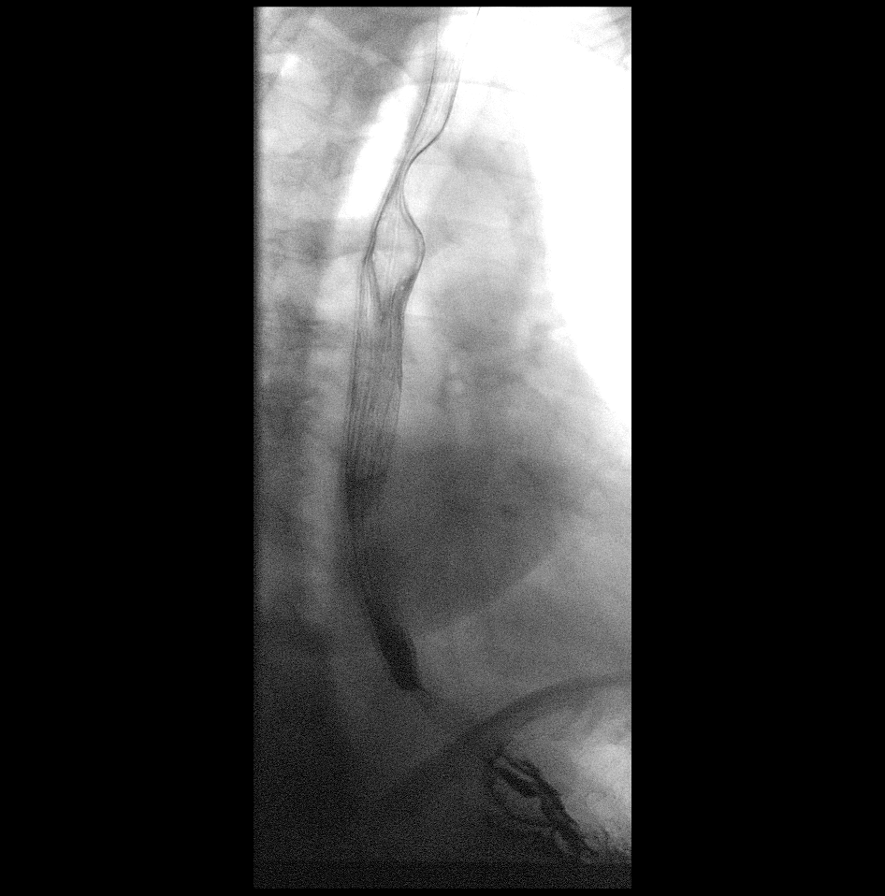

[Series 8: cp_standard · 0.25mm/px · 1 of 1 slices shown (5 of 8)]
[im 1/1]
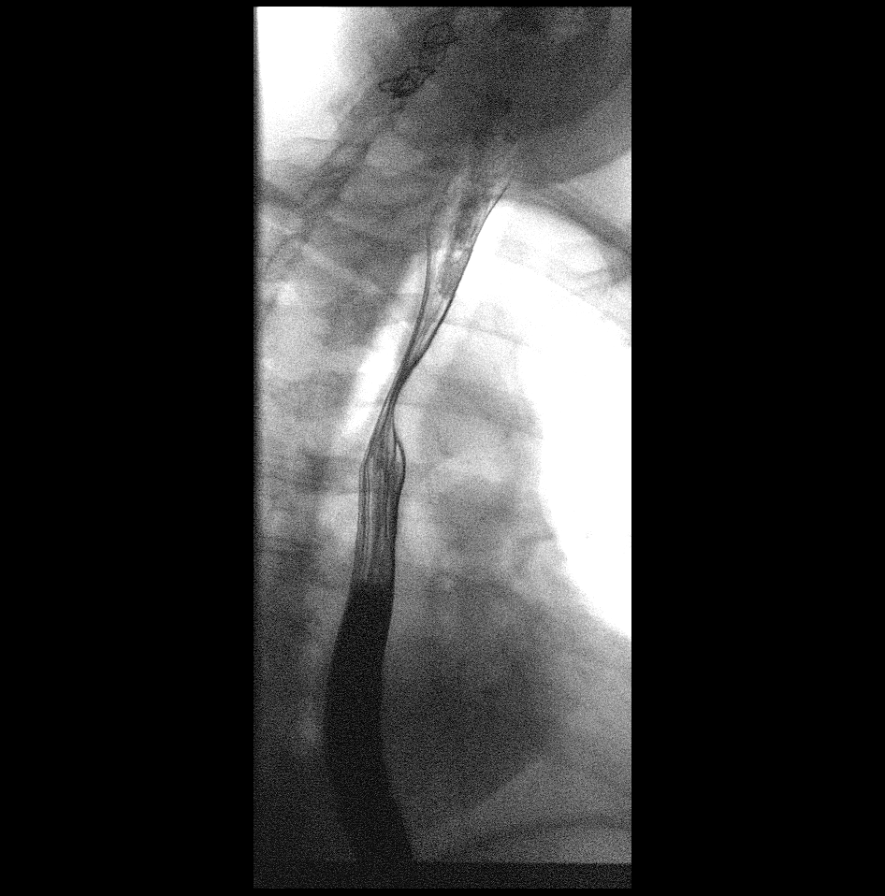

[Series 10: cp_standard · 0.25mm/px · 1 of 1 slices shown (6 of 8)]
[im 1/1]
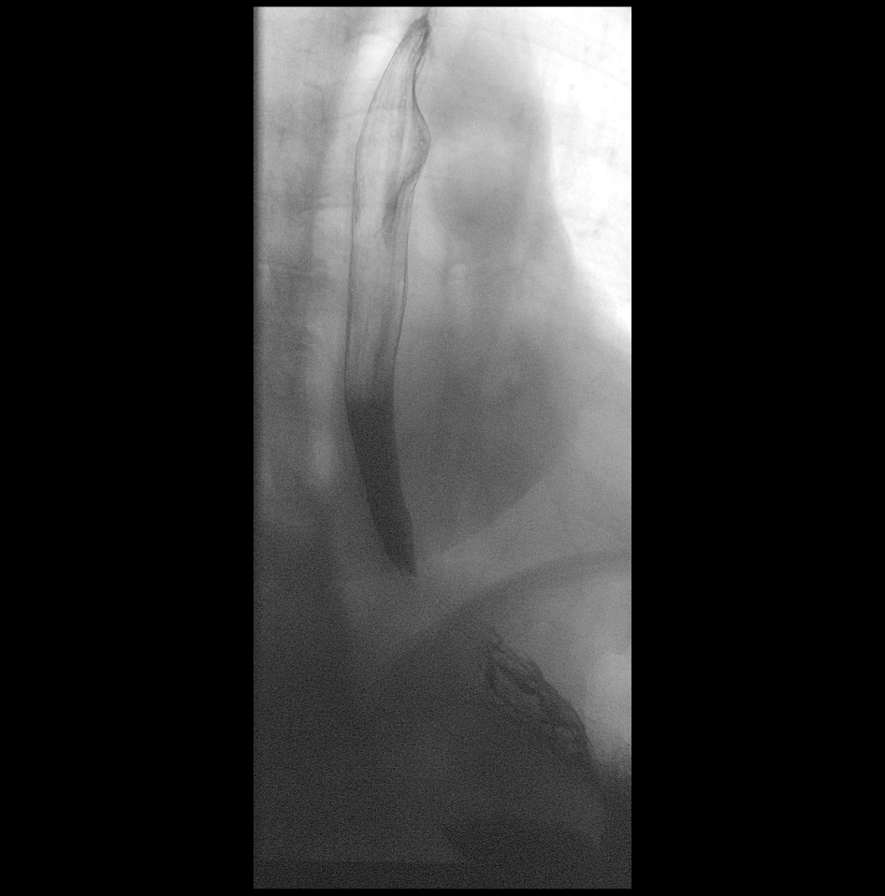

[Series 12: cp_standard · 0.53mm/px · 2 of 28 frames shown (7 of 8)]
[frame 5/28]
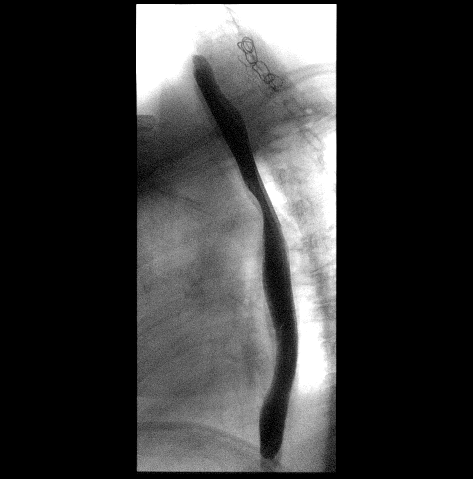
[frame 15/28]
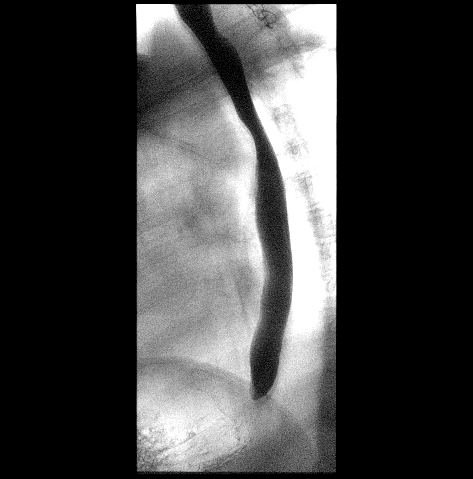

[Series 13: cp_standard · 0.27mm/px · 1 of 1 slices shown (8 of 8)]
[im 1/1]
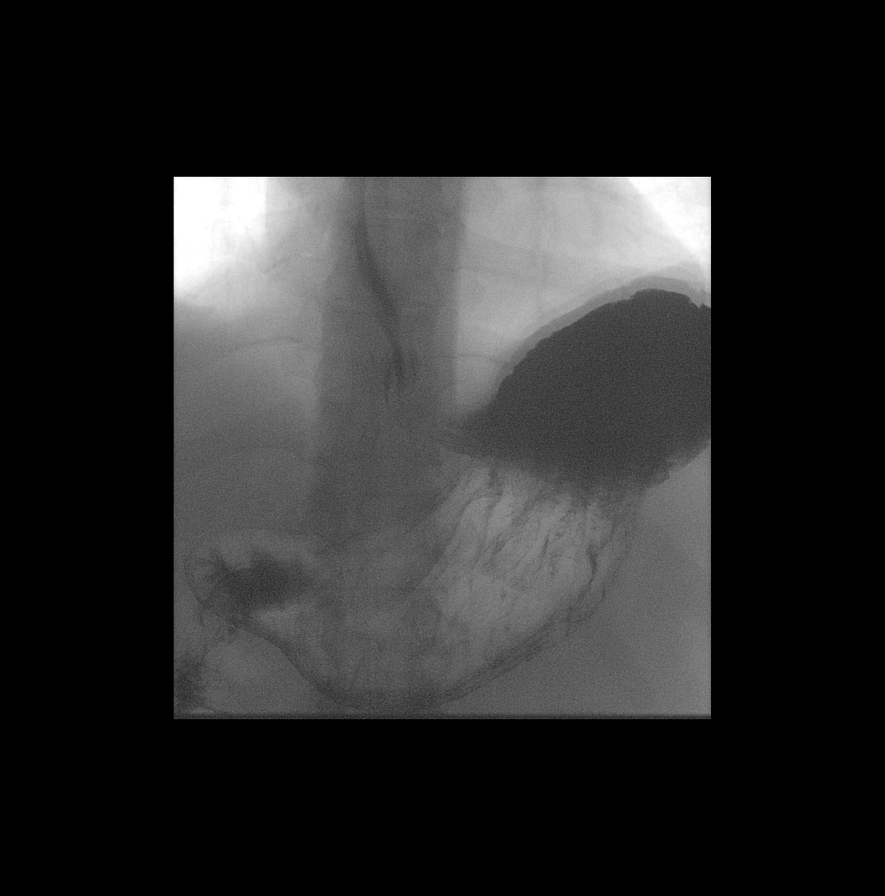

[12 of 24 positions shown; findings below may reference images not displayed]

FINDINGS: There was normal pharyngeal anatomy and motility. Contrast flowed
freely through the esophagus without evidence of stricture or mass.
There was normal esophageal mucosa without evidence of irregularity
or ulceration. Esophageal motility was normal. Mild gastroesophageal
reflux. No definite hiatal hernia was demonstrated.

At the end of the examination a 13 mm barium tablet was administered
which transited through the esophagus and esophagogastric junction
without delay.
IMPRESSION: 1. Mild gastroesophageal reflux.  Otherwise normal barium swallow.

## 2017-02-13 NOTE — Progress Notes (Signed)
Chief complaint: 1.  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 2.  History of vulvar dysplasia, status post wide local excision with positive margin.  Patient presents for 6 month follow-up for colposcopy for evaluation of dysplasia of the cervix and vulva.  She reports no significant vulvar itching or burning at this time.  She reports no atypical vaginal discharge or bleeding.  Past medical history, past surgical history, problemre reviewed.  OBJECTIVE: BP 111/72   Pulse 80   Ht 5\' 4"  (1.626 m)   Wt 196 lb (88.9 kg)   LMP 03/07/2012 (Approximate)   BMI 33.64 kg/m  Pleasant, well-appearing female in no acute distress.  Alert and oriented. 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  No gross lesions or epithelial skin abnormalities. BUS-atrophic changes. Vagina-moderate atrophy Cervix-poorly visualized due to body habitus but without gross lesions; colposcopy cannot be accomplished today because of patient discomfort and lack of visualization of cervix and upper vagina; ECC is performed. Uterus-not examined Adnexa-nontender, examined, Rectovaginal-normal.  External exam.  PROCEDURE: Vulvar colposcopy. Consent is obtained (verbal).  Patient is placed in the dorsal lithotomy position.  Acetic acid soaked 4 x 4 pads are placed on the vulva and perineum for several minutes in order to get maximal effect for colposcopy.  The colposcope was used to assess the vulva for abnormality.  No acetowhite lesions are seen.  No atypical vessels or punctation is identified.  ASSESSMENT: 1.  History of cervical dysplasia; status post LEEP cone biopsy with a post LEEP  ECC positive for residual dysplasia; Colposcopy cannot be accomplished today due to body  And patient discomfort. 2.  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   Normal vulvar colposcopy today.  PLAN: 1.  ECC of cervix. 2.  Vulvar colposcopy is noted. 3.  Return in 6 months for repeat colposcopy  Brayton Mars, MD  Note: This dictation was prepared with Dragon dictation along with smaller phrase technology. Any transcriptional errors that result from this process are unintentional.

## 2017-02-14 LAB — PATHOLOGY

## 2017-02-19 ENCOUNTER — Telehealth: Payer: Self-pay | Admitting: Obstetrics and Gynecology

## 2017-02-19 NOTE — Telephone Encounter (Signed)
Pt left message on voicemail to see if her results were in and if a nurse could return her call to give her the results. Please advise. Thanks TNP

## 2017-02-19 NOTE — Telephone Encounter (Signed)
Pls review path. ty cm

## 2017-02-21 NOTE — Telephone Encounter (Signed)
Pt stated that she had a missed call from our office but no voicemail. Pt request a call back to get her results. Please advise. Thanks TNP

## 2017-02-21 NOTE — Telephone Encounter (Signed)
Pt aware per vm.  

## 2017-02-21 NOTE — Telephone Encounter (Signed)
Pt aware path neg.

## 2017-02-26 ENCOUNTER — Telehealth: Payer: Self-pay | Admitting: Pharmacist

## 2017-02-26 NOTE — Telephone Encounter (Signed)
02/26/17 Pro Air - changed provider to [facility], printed script and sending to [facility] for signature to refill.Delos Haring

## 2017-03-03 ENCOUNTER — Emergency Department
Admission: EM | Admit: 2017-03-03 | Discharge: 2017-03-03 | Disposition: A | Discharge status: Home / Self Care | Payer: PRIVATE HEALTH INSURANCE | Attending: Emergency Medicine | Admitting: Emergency Medicine

## 2017-03-03 ENCOUNTER — Emergency Department: Payer: PRIVATE HEALTH INSURANCE

## 2017-03-03 DIAGNOSIS — E079 Disorder of thyroid, unspecified: Secondary | ICD-10-CM | POA: Insufficient documentation

## 2017-03-03 DIAGNOSIS — E039 Hypothyroidism, unspecified: Secondary | ICD-10-CM | POA: Insufficient documentation

## 2017-03-03 DIAGNOSIS — F17210 Nicotine dependence, cigarettes, uncomplicated: Secondary | ICD-10-CM | POA: Insufficient documentation

## 2017-03-03 DIAGNOSIS — B9789 Other viral agents as the cause of diseases classified elsewhere: Secondary | ICD-10-CM

## 2017-03-03 DIAGNOSIS — J069 Acute upper respiratory infection, unspecified: Secondary | ICD-10-CM

## 2017-03-03 LAB — CBC WITH DIFFERENTIAL/PLATELET
BASOS PCT: 1 %
Basophils Absolute: 0.1 10*3/uL (ref 0–0.1)
EOS ABS: 0.2 10*3/uL (ref 0–0.7)
EOS PCT: 2 %
HCT: 41 % (ref 35.0–47.0)
HEMOGLOBIN: 14.2 g/dL (ref 12.0–16.0)
Lymphocytes Relative: 24 %
Lymphs Abs: 2.3 10*3/uL (ref 1.0–3.6)
MCH: 31.4 pg (ref 26.0–34.0)
MCHC: 34.6 g/dL (ref 32.0–36.0)
MCV: 90.6 fL (ref 80.0–100.0)
MONOS PCT: 9 %
Monocytes Absolute: 0.9 10*3/uL (ref 0.2–0.9)
NEUTROS PCT: 64 %
Neutro Abs: 6.4 10*3/uL (ref 1.4–6.5)
PLATELETS: 282 10*3/uL (ref 150–440)
RBC: 4.53 MIL/uL (ref 3.80–5.20)
RDW: 14.7 % — ABNORMAL HIGH (ref 11.5–14.5)
WBC: 9.8 10*3/uL (ref 3.6–11.0)

## 2017-03-03 LAB — BASIC METABOLIC PANEL
Anion gap: 7 (ref 5–15)
BUN: 13 mg/dL (ref 6–20)
CALCIUM: 8.8 mg/dL — AB (ref 8.9–10.3)
CHLORIDE: 106 mmol/L (ref 101–111)
CO2: 28 mmol/L (ref 22–32)
CREATININE: 0.94 mg/dL (ref 0.44–1.00)
Glucose, Bld: 96 mg/dL (ref 65–99)
Potassium: 3.4 mmol/L — ABNORMAL LOW (ref 3.5–5.1)
SODIUM: 141 mmol/L (ref 135–145)

## 2017-03-03 MED ORDER — PROCHLORPERAZINE MALEATE 10 MG PO TABS: 10.0000 mg | ORAL_TABLET | Freq: Four times a day (QID) | ORAL | 0 refills | Status: DC | PRN

## 2017-03-03 MED ORDER — IPRATROPIUM-ALBUTEROL 0.5-2.5 (3) MG/3ML IN SOLN
3.0000 mL | Freq: Once | RESPIRATORY_TRACT | Status: AC
  Administered 2017-03-03: 3 mL via RESPIRATORY_TRACT
  Filled 2017-03-03 (×2): qty 3

## 2017-03-03 MED ORDER — SODIUM CHLORIDE 0.9 % IV BOLUS (SEPSIS)
1000.0000 mL | Freq: Once | INTRAVENOUS | Status: AC
  Administered 2017-03-03: 1000 mL via INTRAVENOUS

## 2017-03-03 MED ORDER — ALBUTEROL SULFATE (2.5 MG/3ML) 0.083% IN NEBU
5.0000 mg | INHALATION_SOLUTION | Freq: Once | RESPIRATORY_TRACT | Status: AC
  Administered 2017-03-03: 5 mg via RESPIRATORY_TRACT
  Filled 2017-03-03: qty 6

## 2017-03-03 MED ORDER — PREDNISONE 20 MG PO TABS: 40.0000 mg | ORAL_TABLET | Freq: Every day | ORAL | 0 refills | Status: DC

## 2017-03-03 MED ORDER — BENZONATATE 100 MG PO CAPS: 100.0000 mg | ORAL_CAPSULE | Freq: Four times a day (QID) | ORAL | 0 refills | Status: DC | PRN

## 2017-03-03 MED ORDER — PROCHLORPERAZINE EDISYLATE 5 MG/ML IJ SOLN
10.0000 mg | Freq: Once | INTRAMUSCULAR | Status: AC
  Administered 2017-03-03: 10 mg via INTRAVENOUS
  Filled 2017-03-03 (×2): qty 2

## 2017-03-03 MED ORDER — METHYLPREDNISOLONE SODIUM SUCC 125 MG IJ SOLR
125.0000 mg | Freq: Once | INTRAMUSCULAR | Status: AC
  Administered 2017-03-03: 125 mg via INTRAVENOUS
  Filled 2017-03-03 (×2): qty 2

## 2017-03-03 NOTE — ED Provider Notes (Signed)
[facility] Emergency Department Provider Note   ____________________________________________   I have reviewed the triage vital signs and the nursing notes.   HISTORY  Chief Complaint Cough   History limited by: Not Limited   HPI Brittney Chavez is a 56 y.o. female who presents to the emergency department today because of concerns for cough, body aches, chills. Patient states that she started feeling bad yesterday evening. It occurred while she was at a football game. She started feeling a tingling in her throat. She states that this progressed quickly. She now complaining of significant cough. She is also having some difficulty with breathing and body aches. She has not had any measured fevers but has had chills. She does have an inhaler at home for her allergies but states that this is not been effective.  Past Medical History:  Diagnosis Date  . Abnormal Pap smear of cervix   . Anxiety   . Arthritis   . Barrett esophagus   . 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 Colon polyp    unc  . DDD (degenerative disc disease), lumbar   . GERD (gastroesophageal reflux disease)   . Hepatitis C   . Hepatitis C   . Hypothyroidism   . PTSD (post-traumatic stress disorder)   . Scoliosis   . Thyroid disease   . Torn ACL (anterior cruciate ligament)    Left Knee    Patient Active Problem List   Diagnosis Date Noted  . 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 Dysplasia of cervix 05/02/2016  . 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 Tobacco user 04/17/2016  . Vulvar lesion 04/17/2016  . Scoliosis 03/21/2016  . History of underactive thyroid 03/21/2016  . Dysphagia 03/21/2016    Past Surgical History:  Procedure Laterality Date  . CERVICAL BIOPSY  W/ LOOP ELECTRODE EXCISION    . CERVICAL CONE BIOPSY    . CERVIX LESION DESTRUCTION    . DILATION AND CURETTAGE OF UTERUS     x 2  . LEEP N/A 06/04/2016   Procedure: LOOP  ELECTROSURGICAL EXCISION PROCEDURE (LEEP);  Surgeon: Brayton Mars, MD;  Location: [facility] ORS;  Service: Gynecology;  Laterality: N/A;  . NECK SURGERY     Cervical Fusion  . THYROIDECTOMY, PARTIAL Bilateral   . TONSILLECTOMY     as a child  . TUBAL LIGATION      Prior to Admission medications   Medication Sig Start Date End Date Taking? Authorizing Provider  Sofosbuvir-Velpatasvir (EPCLUSA) 400-100 MG TABS Take by mouth.   Yes [provider]  acetaminophen (TYLENOL) 500 MG tablet Take 500 mg by mouth every 6 (six) hours as needed for mild pain.     [provider]  albuterol (PROVENTIL HFA;VENTOLIN HFA) 108 (90 Base) MCG/ACT inhaler Inhale 2 puffs into the lungs every 6 (six) hours as needed for wheezing or shortness of breath.     [provider]  cetirizine (ZYRTEC) 10 MG tablet Take 10 mg by mouth daily.    [provider]  gabapentin (NEURONTIN) 600 MG tablet Take 600 mg by mouth 3 (three) times daily.     [provider]  gentamicin (GARAMYCIN) 0.3 % ophthalmic solution Place 2 drops into the right eye every 4 (four) hours. While awake 08/18/16   Letitia Neri L, PA-C  hydrochlorothiazide (HYDRODIURIL) 25 MG tablet TAKE ONE TABLET BY MOUTH EVERY DAY 02/05/17   Tawni Millers, MD  levothyroxine (SYNTHROID, LEVOTHROID) 100  MCG tablet Take 100 mcg by mouth daily before breakfast.     [provider]  omeprazole (PRILOSEC) 40 MG capsule Take 40 mg by mouth 2 (two) times daily.     [provider]  Sofosbuvir-Velpatasvir 400-100 MG TABS Take by mouth. 11/20/16   [provider]  valACYclovir (VALTREX) 1000 MG tablet Take 1,000 mg by mouth daily as needed. FLARE UPS    [provider]    Allergies Bee pollen; Nsaids; and Pollen extract  Family History  Problem Relation Age of Onset  . Cancer Mother   . Diabetes Mother   . COPD Mother   . Breast cancer Mother 90  . COPD Father   . Stroke Father   .  Polycystic ovary syndrome Daughter   . Birth defects Maternal Grandmother   . Colon cancer Maternal Grandmother   . Birth defects Paternal Grandmother   . Ovarian cancer Neg Hx   . Heart disease Neg Hx     Social History Social History  Substance Use Topics  . Smoking status: Current Every Day Smoker    Packs/day: 1.00    Years: 30.00    Types: Cigarettes  . Smokeless tobacco: Never Used  . Alcohol use No    Review of Systems Constitutional: Positive for chills. Eyes: No visual changes. ENT: Positive for tickle in the throat.  Cardiovascular: Denies chest pain. Respiratory: Positive for shortness of breath and cough.  Gastrointestinal: No abdominal pain.  Positive for nausea.  Genitourinary: Negative for dysuria. Musculoskeletal: Positive for muscle aches.  Skin: Negative for rash. Neurological: Positive for headache.   ____________________________________________   PHYSICAL EXAM:  VITAL SIGNS: ED Triage Vitals  Enc Vitals Group     BP 03/03/17 1953 (!) 151/82     Pulse Rate 03/03/17 1953 90     Resp 03/03/17 1953 (!) 30     Temp 03/03/17 1953 99.5 F (37.5 C)     Temp Source 03/03/17 1953 Oral     SpO2 03/03/17 1953 96 %     Weight 03/03/17 1953 195 lb (88.5 kg)     Height 03/03/17 1953 5\' 4"  (1.626 m)     Head Circumference --      Peak Flow --      Pain Score 03/03/17 1952 8   Constitutional: Alert and oriented. Well appearing and in no distress. Eyes: Conjunctivae are normal.  ENT   Head: Normocephalic and atraumatic.   Nose: No congestion/rhinnorhea.   Mouth/Throat: Mucous membranes are moist.   Neck: No stridor. Hematological/Lymphatic/Immunilogical: No cervical lymphadenopathy. Cardiovascular: Normal rate, regular rhythm.  No murmurs, rubs, or gallops.  Respiratory: Increased respiratory effort with diffuse expiratory wheezing.  Gastrointestinal: Soft and non tender. No rebound. No guarding.  Genitourinary: Deferred Musculoskeletal:  Normal range of motion in all extremities. No lower extremity edema. Neurologic:  Normal speech and language. No gross focal neurologic deficits are appreciated.  Skin:  Skin is warm, dry and intact. No rash noted. Psychiatric: Mood and affect are normal. Speech and behavior are normal. Patient exhibits appropriate insight and judgment.  ____________________________________________    LABS (pertinent positives/negatives)  Labs Reviewed  CBC WITH DIFFERENTIAL/PLATELET - Abnormal; Notable for the following:       Result Value   RDW 14.7 (*)    All other components within normal limits  BASIC METABOLIC PANEL - Abnormal; Notable for the following:    Potassium 3.4 (*)    Calcium 8.8 (*)    All other components within  normal limits     ____________________________________________   EKG  I, Nance Pear, attending physician, personally viewed and interpreted this EKG  EKG Time: 2000 Rate: 92 Rhythm: normal sinus rhythm Axis: right axis deviation Intervals: qtc 437 QRS: low voltage ST changes: no st elevation Impression: abnormal ekg   ____________________________________________    RADIOLOGY  CXR IMPRESSION:  No acute disease. The lungs appear emphysematous.    Atherosclerosis.    ____________________________________________   PROCEDURES  Procedures  ____________________________________________   INITIAL IMPRESSION / ASSESSMENT AND PLAN / ED COURSE  Pertinent labs & imaging results that were available during my care of the patient were reviewed by me and considered in my medical decision making (see chart for details).  Patient presented to the emergency department today with signs and symptoms consistent with a viral upper respiratory infection. Patient did feel better after DuoNeb treatments, steroids IV fluids and Compazine. Chest x-ray without any pneumonia. Blood work without concerning  findings.  ____________________________________________   FINAL CLINICAL IMPRESSION(S) / ED DIAGNOSES  Final diagnoses:  Viral URI with cough     Note: This dictation was prepared with Dragon dictation. Any transcriptional errors that result from this process are unintentional     Nance Pear, MD 03/03/17 2303

## 2017-03-03 NOTE — ED Triage Notes (Signed)
Pt states that she started out with a tickle in her throat, pt states that she is coughing so bad she can't use her inhaler, pt has audible expiratory wheezing. Pt reports body aches as well as headache

## 2017-03-03 NOTE — Discharge Instructions (Signed)
Please seek medical attention for any high fevers, chest pain, shortness of breath, change in behavior, persistent vomiting, bloody stool or any other new or concerning symptoms.  

## 2017-03-05 ENCOUNTER — Ambulatory Visit
Admission: EM | Admit: 2017-03-05 | Discharge: 2017-03-05 | Disposition: A | Discharge status: Home / Self Care | Payer: PRIVATE HEALTH INSURANCE | Attending: Family Medicine | Admitting: Family Medicine

## 2017-03-05 DIAGNOSIS — R0602 Shortness of breath: Secondary | ICD-10-CM

## 2017-03-05 DIAGNOSIS — J40 Bronchitis, not specified as acute or chronic: Secondary | ICD-10-CM

## 2017-03-05 DIAGNOSIS — J439 Emphysema, unspecified: Secondary | ICD-10-CM

## 2017-03-05 DIAGNOSIS — R062 Wheezing: Secondary | ICD-10-CM

## 2017-03-05 MED ORDER — HYDROCOD POLST-CPM POLST ER 10-8 MG/5ML PO SUER: 5.0000 mL | Freq: Two times a day (BID) | ORAL | 0 refills | Status: DC

## 2017-03-05 MED ORDER — DOXYCYCLINE HYCLATE 100 MG PO CAPS: 100.0000 mg | ORAL_CAPSULE | Freq: Two times a day (BID) | ORAL | 0 refills | Status: DC

## 2017-03-05 MED ORDER — IPRATROPIUM-ALBUTEROL 0.5-2.5 (3) MG/3ML IN SOLN
3.0000 mL | Freq: Once | RESPIRATORY_TRACT | Status: AC
  Administered 2017-03-05: 3 mL via RESPIRATORY_TRACT

## 2017-03-05 NOTE — ED Triage Notes (Signed)
Was seen in ED on 03/03/2017 for viral URI with cough, and was treated with prednisone, compazine, and tessalon perles. She is getting worse. She has been sick x 4 days. She is having cough (productive of yellow to green sputum), wheezing

## 2017-03-05 NOTE — ED Provider Notes (Signed)
[facility]    CSN: [id number] Arrival date & time: 03/05/17  1847     History   Chief Complaint Chief Complaint  Patient presents with  . URI    HPI Brittney Chavez is a 56 y.o. female.   HPI   56 year old female with the numerous comorbidities who presents with coughing shortness of breath and fatigue that she's had for 4 days. Review her medical records show she was seen at the [facility] emergency department 2 days prior to this visit workup but showed that she had a likely viral bronchitis. X-rays did show emphysematous changes. She states that since you visited the emergency room she's been taking the prednisone as prescribed and Tessalon Perles but despite this has been coughing more frequently deeper and is feeling worse. She is afebrile. O2 sats are running between 97/98%. She has been wheezing. She been using her inhalers at home but has not been getting significant relief. She continues to smoke a pack a day.          Past Medical History:  Diagnosis Date  . Abnormal Pap smear of cervix   . Anxiety   . Arthritis   . Barrett esophagus   . 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 Colon polyp    unc  . DDD (degenerative disc disease), lumbar   . GERD (gastroesophageal reflux disease)   . Hepatitis C   . Hepatitis C   . Hypothyroidism   . PTSD (post-traumatic stress disorder)   . Scoliosis   . Thyroid disease   . Torn ACL (anterior cruciate ligament)    Left Knee    Patient Active Problem List   Diagnosis Date Noted  . 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 Dysplasia of cervix 05/02/2016  . 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 Tobacco user 04/17/2016  . Vulvar lesion 04/17/2016  . Scoliosis 03/21/2016  . History of underactive thyroid 03/21/2016  . Dysphagia 03/21/2016    Past Surgical History:  Procedure Laterality Date  . CERVICAL BIOPSY  W/ LOOP ELECTRODE EXCISION    . CERVICAL CONE BIOPSY    . CERVIX  LESION DESTRUCTION    . DILATION AND CURETTAGE OF UTERUS     x 2  . LEEP N/A 06/04/2016   Procedure: LOOP ELECTROSURGICAL EXCISION PROCEDURE (LEEP);  Surgeon: Brayton Mars, MD;  Location: [facility] ORS;  Service: Gynecology;  Laterality: N/A;  . NECK SURGERY     Cervical Fusion  . THYROIDECTOMY, PARTIAL Bilateral   . TONSILLECTOMY     as a child  . TUBAL LIGATION      OB History    Gravida Para Term Preterm AB Living   5 2 2   3 2    SAB TAB Ectopic Multiple Live Births   3       2       Home Medications    Prior to Admission medications   Medication Sig Start Date End Date Taking? Authorizing Provider  acetaminophen (TYLENOL) 500 MG tablet Take 500 mg by mouth every 6 (six) hours as needed for mild pain.    Yes [provider]  albuterol (PROVENTIL HFA;VENTOLIN HFA) 108 (90 Base) MCG/ACT inhaler Inhale 2 puffs into the lungs every 6 (six) hours as needed for wheezing or shortness of breath.    Yes [provider]  benzonatate (TESSALON PERLES) 100 MG capsule Take 1 capsule (100 mg total) by mouth every 6 (six) hours as  needed for cough. 03/03/17 03/03/18 Yes Nance Pear, MD  cetirizine (ZYRTEC) 10 MG tablet Take 10 mg by mouth daily.   Yes [provider]  gabapentin (NEURONTIN) 600 MG tablet Take 600 mg by mouth 3 (three) times daily.    Yes [provider]  hydrochlorothiazide (HYDRODIURIL) 25 MG tablet TAKE ONE TABLET BY MOUTH EVERY DAY 02/05/17  Yes Chaplin, Dianah Field, MD  levothyroxine (SYNTHROID, LEVOTHROID) 100 MCG tablet Take 100 mcg by mouth daily before breakfast.    Yes [provider]  omeprazole (PRILOSEC) 40 MG capsule Take 40 mg by mouth 2 (two) times daily.    Yes [provider]  predniSONE (DELTASONE) 20 MG tablet Take 2 tablets (40 mg total) by mouth daily. 03/03/17  Yes Nance Pear, MD  prochlorperazine (COMPAZINE) 10 MG tablet Take 1 tablet (10 mg total) by mouth every 6 (six) hours as needed for nausea or  vomiting (headache). 03/03/17  Yes Nance Pear, MD  Sofosbuvir-Velpatasvir (EPCLUSA) 400-100 MG TABS Take by mouth.   Yes [provider]  valACYclovir (VALTREX) 1000 MG tablet Take 1,000 mg by mouth daily as needed. FLARE UPS   Yes [provider]  chlorpheniramine-HYDROcodone (TUSSIONEX PENNKINETIC ER) 10-8 MG/5ML SUER Take 5 mLs by mouth 2 (two) times daily. 03/05/17   Lorin Picket, PA-C  doxycycline (VIBRAMYCIN) 100 MG capsule Take 1 capsule (100 mg total) by mouth 2 (two) times daily. 03/05/17   Lorin Picket, PA-C  gentamicin (GARAMYCIN) 0.3 % ophthalmic solution Place 2 drops into the right eye every 4 (four) hours. While awake 08/18/16   Johnn Hai, PA-C  Sofosbuvir-Velpatasvir 400-100 MG TABS Take by mouth. 11/20/16   [provider]    Family History Family History  Problem Relation Age of Onset  . Cancer Mother   . Diabetes Mother   . COPD Mother   . Breast cancer Mother 65  . COPD Father   . Stroke Father   . Polycystic ovary syndrome Daughter   . Birth defects Maternal Grandmother   . Colon cancer Maternal Grandmother   . Birth defects Paternal Grandmother   . Ovarian cancer Neg Hx   . Heart disease Neg Hx     Social History Social History  Substance Use Topics  . Smoking status: Current Every Day Smoker    Packs/day: 1.00    Years: 30.00    Types: Cigarettes  . Smokeless tobacco: Never Used  . Alcohol use No     Allergies   Bee pollen; Nsaids; and Pollen extract   Review of Systems Review of Systems  Constitutional: Positive for activity change, appetite change and fatigue. Negative for chills and fever.  HENT: Positive for congestion, postnasal drip, sinus pain and sinus pressure.   Respiratory: Positive for cough, shortness of breath and wheezing.   All other systems reviewed and are negative.    Physical Exam Triage Vital Signs ED Triage Vitals  Enc Vitals Group     BP 03/05/17 2020 (!) 148/89     Pulse  Rate 03/05/17 2020 81     Resp 03/05/17 2020 (!) 22     Temp 03/05/17 2020 98.2 F (36.8 C)     Temp Source 03/05/17 2020 Oral     SpO2 03/05/17 2020 98 %     Weight 03/05/17 2021 198 lb (89.8 kg)     Height 03/05/17 2021 5\' 4"  (1.626 m)     Head Circumference --      Peak Flow --  Pain Score 03/05/17 2021 8     Pain Loc --      Pain Edu? --      Excl. in [place]? --    No data found.   Updated Vital Signs BP (!) 148/89 (BP Location: Left Arm)   Pulse 81   Temp 98.2 F (36.8 C) (Oral)   Resp (!) 22   Ht 5\' 4"  (1.626 m)   Wt 198 lb (89.8 kg)   LMP 03/07/2012 (Approximate)   SpO2 97%   BMI 33.99 kg/m   Visual Acuity Right Eye Distance:   Left Eye Distance:   Bilateral Distance:    Right Eye Near:   Left Eye Near:    Bilateral Near:     Physical Exam  Constitutional: She is oriented to person, place, and time. She appears well-developed and well-nourished. No distress.  HENT:  Head: Normocephalic.  Right Ear: External ear normal.  Left Ear: External ear normal.  Nose: Nose normal.  Mouth/Throat: Oropharynx is clear and moist. No oropharyngeal exudate.  Eyes: Pupils are equal, round, and reactive to light. Right eye exhibits no discharge. Left eye exhibits no discharge.  Neck: Normal range of motion.  Pulmonary/Chest: She has wheezes.  Patient as a poor inspiratory effort. She has wheezing throughout all lung fields.  Musculoskeletal: Normal range of motion.  Lymphadenopathy:    She has no cervical adenopathy.  Neurological: She is alert and oriented to person, place, and time.  Skin: Skin is warm and dry. She is not diaphoretic.  Psychiatric: She has a normal mood and affect. Her behavior is normal. Judgment and thought content normal.  Nursing note and vitals reviewed.    UC Treatments / Results  Labs (all labs ordered are listed, but only abnormal results are displayed) Labs Reviewed - No data to display  EKG  EKG Interpretation None        Radiology No results found.  Procedures Procedures (including critical care time)  Medications Ordered in UC Medications  ipratropium-albuterol (DUONEB) 0.5-2.5 (3) MG/3ML nebulizer solution 3 mL (3 mLs Nebulization Given 03/05/17 2058)   Patient was improved after the DuoNeb treatment. Wheezing had decreased significantly.  Initial Impression / Assessment and Plan / UC Course  I have reviewed the triage vital signs and the nursing notes.  Pertinent labs & imaging results that were available during my care of the patient were reviewed by me and considered in my medical decision making (see chart for details).     Plan: 1. Test/x-ray results and diagnosis reviewed with patient 2. rx as per orders; risks, benefits, potential side effects reviewed with patient 3. Recommend supportive treatment with use of Flonase and inhalers as necessary. Stop smoking. Given following up with her primary care physician next week for evaluation of emphysema/COPD. If she worsens she should go immediately to the emergency room. 4. F/u prn if symptoms worsen or don't improve   Final Clinical Impressions(s) / UC Diagnoses   Final diagnoses:  Bronchitis  Pulmonary emphysema, unspecified emphysema type ([place])    New Prescriptions Discharge Medication List as of 03/05/2017  9:06 PM    START taking these medications   Details  chlorpheniramine-HYDROcodone (TUSSIONEX PENNKINETIC ER) 10-8 MG/5ML SUER Take 5 mLs by mouth 2 (two) times daily., Starting Tue 03/05/2017, Print    doxycycline (VIBRAMYCIN) 100 MG capsule Take 1 capsule (100 mg total) by mouth 2 (two) times daily., Starting Tue 03/05/2017, Normal         Controlled Substance Prescriptions [place]  Controlled Substance Registry consulted? Not Applicable   Lorin Picket, PA-C 03/05/17 2119

## 2017-03-05 NOTE — Discharge Instructions (Signed)
Use of Flonase daily for one month. Finish the prednisone received at the emergency room. Use your inhaler as necessary. Stop smoking. If you worsen ,have more trouble breathing  go to the emergency room immediately. Otherwise follow-up with your primary care physician next week.

## 2017-03-22 ENCOUNTER — Telehealth: Payer: Self-pay | Admitting: Pharmacist

## 2017-03-22 NOTE — Telephone Encounter (Signed)
03/22/17 Faxed script to [facility] for refill on Pro Air HFA Inhale 2 puffs every 4-6 hours as needed for shortness of breath.Delos Haring

## 2017-03-26 ENCOUNTER — Ambulatory Visit: Payer: PRIVATE HEALTH INSURANCE | Admitting: Urology

## 2017-03-26 VITALS — BP 134/89 | HR 76 | Temp 97.5°F | Resp 16 | Ht 64.0 in | Wt 197.7 lb

## 2017-03-26 DIAGNOSIS — J40 Bronchitis, not specified as acute or chronic: Secondary | ICD-10-CM

## 2017-03-26 DIAGNOSIS — I10 Essential (primary) hypertension: Secondary | ICD-10-CM

## 2017-03-26 MED ORDER — GENTAMICIN SULFATE 0.3 % OP SOLN: 2.0000 [drp] | OPHTHALMIC | 0 refills | Status: DC

## 2017-03-26 MED ORDER — VALACYCLOVIR HCL 1 G PO TABS: 1000.0000 mg | ORAL_TABLET | Freq: Every day | ORAL | 0 refills | Status: AC | PRN

## 2017-03-26 MED ORDER — CETIRIZINE HCL 10 MG PO TABS: 10.0000 mg | ORAL_TABLET | Freq: Every day | ORAL | Status: AC

## 2017-03-26 MED ORDER — CYCLOBENZAPRINE HCL 10 MG PO TABS: 10.0000 mg | ORAL_TABLET | Freq: Three times a day (TID) | ORAL | 0 refills | Status: DC

## 2017-03-26 MED ORDER — HYDROCHLOROTHIAZIDE 25 MG PO TABS: 25.0000 mg | ORAL_TABLET | Freq: Every day | ORAL | 3 refills | Status: DC

## 2017-03-26 MED ORDER — LEVOTHYROXINE SODIUM 100 MCG PO TABS: 100.0000 ug | ORAL_TABLET | Freq: Every day | ORAL | 0 refills | Status: DC

## 2017-03-26 MED ORDER — OMEPRAZOLE 40 MG PO CPDR: 40.0000 mg | DELAYED_RELEASE_CAPSULE | Freq: Two times a day (BID) | ORAL | 0 refills | Status: DC

## 2017-03-26 MED ORDER — ALBUTEROL SULFATE HFA 108 (90 BASE) MCG/ACT IN AERS: 2.0000 | INHALATION_SPRAY | Freq: Four times a day (QID) | RESPIRATORY_TRACT | 3 refills | Status: DC | PRN

## 2017-03-26 NOTE — Progress Notes (Signed)
Patient: Brittney Chavez Female    DOB: Feb 26, 1961   56 y.o.   MRN: [id number] Visit Date: 03/26/2017  Today's Provider: [facility]   No chief complaint on file.  Subjective:    HPI Patient is following up from ED visit for COPD exacerbation.  She finished her Harvoni on Sunday  She has set a quit date for 10/08.  She is seeing the spine doctor on 10/09.      Allergies  Allergen Reactions  . Bee Pollen Shortness Of Breath and Swelling  . Nsaids Nausea Only    Other reaction(s): OTHER Blisters in mouth  . Pollen Extract    Previous Medications   ACETAMINOPHEN (TYLENOL) 500 MG TABLET    Take 500 mg by mouth every 6 (six) hours as needed for mild pain.    ALBUTEROL (PROVENTIL HFA;VENTOLIN HFA) 108 (90 BASE) MCG/ACT INHALER    Inhale 2 puffs into the lungs every 6 (six) hours as needed for wheezing or shortness of breath.    BENZONATATE (TESSALON PERLES) 100 MG CAPSULE    Take 1 capsule (100 mg total) by mouth every 6 (six) hours as needed for cough.   CETIRIZINE (ZYRTEC) 10 MG TABLET    Take 10 mg by mouth daily.   CHLORPHENIRAMINE-HYDROCODONE (TUSSIONEX PENNKINETIC ER) 10-8 MG/5ML SUER    Take 5 mLs by mouth 2 (two) times daily.   DOXYCYCLINE (VIBRAMYCIN) 100 MG CAPSULE    Take 1 capsule (100 mg total) by mouth 2 (two) times daily.   GABAPENTIN (NEURONTIN) 600 MG TABLET    Take 600 mg by mouth 3 (three) times daily.    GENTAMICIN (GARAMYCIN) 0.3 % OPHTHALMIC SOLUTION    Place 2 drops into the right eye every 4 (four) hours. While awake   HYDROCHLOROTHIAZIDE (HYDRODIURIL) 25 MG TABLET    TAKE ONE TABLET BY MOUTH EVERY DAY   LEVOTHYROXINE (SYNTHROID, LEVOTHROID) 100 MCG TABLET    Take 100 mcg by mouth daily before breakfast.    OMEPRAZOLE (PRILOSEC) 40 MG CAPSULE    Take 40 mg by mouth 2 (two) times daily.    PREDNISONE (DELTASONE) 20 MG TABLET    Take 2 tablets (40 mg total) by mouth daily.   PROCHLORPERAZINE (COMPAZINE) 10 MG TABLET    Take 1 tablet (10 mg total) by mouth  every 6 (six) hours as needed for nausea or vomiting (headache).   SOFOSBUVIR-VELPATASVIR (EPCLUSA) 400-100 MG TABS    Take by mouth.   SOFOSBUVIR-VELPATASVIR 400-100 MG TABS    Take by mouth.   VALACYCLOVIR (VALTREX) 1000 MG TABLET    Take 1,000 mg by mouth daily as needed. FLARE UPS    Review of Systems  Social History  Substance Use Topics  . Smoking status: Current Every Day Smoker    Packs/day: 1.00    Years: 30.00    Types: Cigarettes  . Smokeless tobacco: Never Used  . Alcohol use No   Objective:   BP 134/89   Pulse 76   Temp (!) 97.5 F (36.4 C)   Resp 16   Ht 5\' 4"  (1.626 m)   Wt 197 lb 11.2 oz (89.7 kg)   LMP 03/07/2012 (Approximate)   BMI 33.94 kg/m   Physical Exam  Constitutional: She appears well-developed and well-nourished.  HENT:  Head: Atraumatic.  Eyes: Conjunctivae are normal.  Cardiovascular: Normal rate and regular rhythm.   Pulmonary/Chest: Effort normal and breath sounds normal.        Assessment & Plan:  1.COPD  - refer to pulmonology  - refilled inhalers  2. Back pain  - seeing spine specialist  - script for flexeril  3. Follow up in one month      [facility]

## 2017-04-04 ENCOUNTER — Telehealth: Payer: Self-pay

## 2017-04-04 NOTE — Telephone Encounter (Signed)
Received PAP application from [facility] for Proair HFA placed for provider to sign.

## 2017-04-04 NOTE — Telephone Encounter (Signed)
Placed signed application/script in [facility] folder for pickup. 

## 2017-04-22 ENCOUNTER — Telehealth: Payer: Self-pay | Admitting: Pharmacist

## 2017-04-22 NOTE — Telephone Encounter (Signed)
04/22/17 Faxed [employer] application for patient assistance for [employer] HFA 0.09mg  Inhale 2 puffs every 4 hours.Delos Haring

## 2017-04-30 ENCOUNTER — Ambulatory Visit: Payer: PRIVATE HEALTH INSURANCE

## 2017-05-24 ENCOUNTER — Telehealth: Payer: Self-pay | Admitting: Pharmacist

## 2017-05-24 NOTE — Telephone Encounter (Signed)
05/24/17 Received note from [place] to check on ProAir. I called [employer] spoke with Kellem she does not have a record of receiving the fax I sent on 04/23/17 explaining how patient is supported, she suggested I refax. I have done so.Brittney Chavez

## 2017-05-27 ENCOUNTER — Ambulatory Visit: Payer: Self-pay

## 2017-05-31 ENCOUNTER — Telehealth: Payer: Self-pay | Admitting: Pharmacist

## 2017-05-31 NOTE — Telephone Encounter (Signed)
05/31/17 Received a faxed letter from [place] dated 05/31/17 stating patient approved for 12 months for [employer], patient case# [id number].Delos Haring

## 2017-07-01 ENCOUNTER — Other Ambulatory Visit: Payer: Self-pay | Admitting: Urology

## 2017-07-01 ENCOUNTER — Other Ambulatory Visit: Payer: Self-pay | Admitting: Internal Medicine

## 2017-07-13 ENCOUNTER — Emergency Department: Payer: Self-pay

## 2017-07-13 ENCOUNTER — Inpatient Hospital Stay
Admission: EM | Admit: 2017-07-13 | Discharge: 2017-07-15 | DRG: 192 | Disposition: A | Discharge status: Home / Self Care | Payer: Self-pay | Attending: Internal Medicine | Admitting: Internal Medicine

## 2017-07-13 ENCOUNTER — Other Ambulatory Visit: Payer: Self-pay

## 2017-07-13 DIAGNOSIS — B1920 Unspecified viral hepatitis C without hepatic coma: Secondary | ICD-10-CM | POA: Diagnosis present

## 2017-07-13 DIAGNOSIS — G4733 Obstructive sleep apnea (adult) (pediatric): Secondary | ICD-10-CM | POA: Diagnosis present

## 2017-07-13 DIAGNOSIS — J440 Chronic obstructive pulmonary disease with acute lower respiratory infection: Secondary | ICD-10-CM | POA: Diagnosis present

## 2017-07-13 DIAGNOSIS — Z800 Family history of malignant neoplasm of digestive organs: Secondary | ICD-10-CM

## 2017-07-13 DIAGNOSIS — Z833 Family history of diabetes mellitus: Secondary | ICD-10-CM

## 2017-07-13 DIAGNOSIS — Z79890 Hormone replacement therapy (postmenopausal): Secondary | ICD-10-CM

## 2017-07-13 DIAGNOSIS — Z86010 Personal history of colonic polyps: Secondary | ICD-10-CM

## 2017-07-13 DIAGNOSIS — E039 Hypothyroidism, unspecified: Secondary | ICD-10-CM | POA: Diagnosis present

## 2017-07-13 DIAGNOSIS — J209 Acute bronchitis, unspecified: Secondary | ICD-10-CM | POA: Diagnosis present

## 2017-07-13 DIAGNOSIS — Z9089 Acquired absence of other organs: Secondary | ICD-10-CM

## 2017-07-13 DIAGNOSIS — M419 Scoliosis, unspecified: Secondary | ICD-10-CM | POA: Diagnosis present

## 2017-07-13 DIAGNOSIS — F4310 Post-traumatic stress disorder, unspecified: Secondary | ICD-10-CM | POA: Diagnosis present

## 2017-07-13 DIAGNOSIS — K2270 Barrett's esophagus without dysplasia: Secondary | ICD-10-CM | POA: Diagnosis present

## 2017-07-13 DIAGNOSIS — Z825 Family history of asthma and other chronic lower respiratory diseases: Secondary | ICD-10-CM

## 2017-07-13 DIAGNOSIS — I10 Essential (primary) hypertension: Secondary | ICD-10-CM | POA: Diagnosis present

## 2017-07-13 DIAGNOSIS — F17210 Nicotine dependence, cigarettes, uncomplicated: Secondary | ICD-10-CM | POA: Diagnosis present

## 2017-07-13 DIAGNOSIS — Z23 Encounter for immunization: Secondary | ICD-10-CM

## 2017-07-13 DIAGNOSIS — K219 Gastro-esophageal reflux disease without esophagitis: Secondary | ICD-10-CM | POA: Diagnosis present

## 2017-07-13 DIAGNOSIS — Z803 Family history of malignant neoplasm of breast: Secondary | ICD-10-CM

## 2017-07-13 DIAGNOSIS — Z91030 Bee allergy status: Secondary | ICD-10-CM

## 2017-07-13 DIAGNOSIS — Z823 Family history of stroke: Secondary | ICD-10-CM

## 2017-07-13 DIAGNOSIS — J441 Chronic obstructive pulmonary disease with (acute) exacerbation: Principal | ICD-10-CM | POA: Diagnosis present

## 2017-07-13 DIAGNOSIS — Z9851 Tubal ligation status: Secondary | ICD-10-CM

## 2017-07-13 DIAGNOSIS — Z888 Allergy status to other drugs, medicaments and biological substances status: Secondary | ICD-10-CM

## 2017-07-13 DIAGNOSIS — Z716 Tobacco abuse counseling: Secondary | ICD-10-CM

## 2017-07-13 LAB — BASIC METABOLIC PANEL
ANION GAP: 10 (ref 5–15)
BUN: 16 mg/dL (ref 6–20)
CALCIUM: 9 mg/dL (ref 8.9–10.3)
CO2: 25 mmol/L (ref 22–32)
CREATININE: 0.95 mg/dL (ref 0.44–1.00)
Chloride: 104 mmol/L (ref 101–111)
Glucose, Bld: 99 mg/dL (ref 65–99)
Potassium: 3.8 mmol/L (ref 3.5–5.1)
SODIUM: 139 mmol/L (ref 135–145)

## 2017-07-13 LAB — CBC
HCT: 43.1 % (ref 35.0–47.0)
HEMOGLOBIN: 14.6 g/dL (ref 12.0–16.0)
MCH: 30.5 pg (ref 26.0–34.0)
MCHC: 33.8 g/dL (ref 32.0–36.0)
MCV: 90.1 fL (ref 80.0–100.0)
PLATELETS: 309 10*3/uL (ref 150–440)
RBC: 4.78 MIL/uL (ref 3.80–5.20)
RDW: 14.3 % (ref 11.5–14.5)
WBC: 8.2 10*3/uL (ref 3.6–11.0)

## 2017-07-13 LAB — INFLUENZA PANEL BY PCR (TYPE A & B)
INFLAPCR: NEGATIVE
INFLBPCR: NEGATIVE

## 2017-07-13 LAB — POCT PREGNANCY, URINE: Preg Test, Ur: NEGATIVE

## 2017-07-13 LAB — TROPONIN I

## 2017-07-13 MED ORDER — LORATADINE 10 MG PO TABS
10.0000 mg | ORAL_TABLET | Freq: Every day | ORAL | Status: DC
Start: 2017-07-13 — End: 2017-07-15
  Administered 2017-07-13 – 2017-07-15 (×3): 10 mg via ORAL
  Filled 2017-07-13 (×3): qty 1

## 2017-07-13 MED ORDER — ACETAMINOPHEN 500 MG PO TABS
1000.0000 mg | ORAL_TABLET | Freq: Once | ORAL | Status: AC
  Administered 2017-07-13: 1000 mg via ORAL
  Filled 2017-07-13: qty 2

## 2017-07-13 MED ORDER — IPRATROPIUM-ALBUTEROL 0.5-2.5 (3) MG/3ML IN SOLN
3.0000 mL | RESPIRATORY_TRACT | Status: DC
Start: 2017-07-13 — End: 2017-07-14
  Administered 2017-07-13: 3 mL via RESPIRATORY_TRACT
  Filled 2017-07-13: qty 3

## 2017-07-13 MED ORDER — ONDANSETRON HCL 4 MG/2ML IJ SOLN
4.0000 mg | Freq: Once | INTRAMUSCULAR | Status: AC
  Administered 2017-07-13: 4 mg via INTRAVENOUS
  Filled 2017-07-13: qty 2

## 2017-07-13 MED ORDER — ACETAMINOPHEN 500 MG PO TABS
500.0000 mg | ORAL_TABLET | Freq: Four times a day (QID) | ORAL | Status: DC | PRN
  Administered 2017-07-13 – 2017-07-15 (×2): 500 mg via ORAL
  Filled 2017-07-13 (×2): qty 1

## 2017-07-13 MED ORDER — PNEUMOCOCCAL VAC POLYVALENT 25 MCG/0.5ML IJ INJ
0.5000 mL | INJECTION | INTRAMUSCULAR | Status: AC
  Administered 2017-07-14: 10:00:00 0.5 mL via INTRAMUSCULAR
  Filled 2017-07-13: qty 0.5

## 2017-07-13 MED ORDER — BUDESONIDE 0.25 MG/2ML IN SUSP
0.2500 mg | Freq: Two times a day (BID) | RESPIRATORY_TRACT | Status: DC
  Administered 2017-07-14 – 2017-07-15 (×3): 0.25 mg via RESPIRATORY_TRACT
  Filled 2017-07-13 (×3): qty 2

## 2017-07-13 MED ORDER — HEPARIN SODIUM (PORCINE) 5000 UNIT/ML IJ SOLN
5000.0000 [IU] | Freq: Three times a day (TID) | INTRAMUSCULAR | Status: DC
  Administered 2017-07-13 – 2017-07-15 (×5): 5000 [IU] via SUBCUTANEOUS
  Filled 2017-07-13 (×5): qty 1

## 2017-07-13 MED ORDER — KETOROLAC TROMETHAMINE 30 MG/ML IJ SOLN: 30.0000 mg | Freq: Once | INTRAMUSCULAR | Status: DC

## 2017-07-13 MED ORDER — HYDROCHLOROTHIAZIDE 25 MG PO TABS
25.0000 mg | ORAL_TABLET | Freq: Every day | ORAL | Status: DC
  Administered 2017-07-14 – 2017-07-15 (×2): 25 mg via ORAL
  Filled 2017-07-13 (×2): qty 1

## 2017-07-13 MED ORDER — DOCUSATE SODIUM 100 MG PO CAPS: 100.0000 mg | ORAL_CAPSULE | Freq: Two times a day (BID) | ORAL | Status: DC | PRN

## 2017-07-13 MED ORDER — INFLUENZA VAC SPLIT QUAD 0.5 ML IM SUSY
0.5000 mL | PREFILLED_SYRINGE | INTRAMUSCULAR | Status: AC
  Administered 2017-07-14: 0.5 mL via INTRAMUSCULAR
  Filled 2017-07-13: qty 0.5

## 2017-07-13 MED ORDER — CYCLOBENZAPRINE HCL 10 MG PO TABS
10.0000 mg | ORAL_TABLET | Freq: Three times a day (TID) | ORAL | Status: DC
  Administered 2017-07-13 – 2017-07-15 (×5): 10 mg via ORAL
  Filled 2017-07-13 (×5): qty 1

## 2017-07-13 MED ORDER — IPRATROPIUM-ALBUTEROL 0.5-2.5 (3) MG/3ML IN SOLN
3.0000 mL | Freq: Once | RESPIRATORY_TRACT | Status: AC
  Administered 2017-07-13: 3 mL via RESPIRATORY_TRACT
  Filled 2017-07-13: qty 3

## 2017-07-13 MED ORDER — LEVOFLOXACIN 500 MG PO TABS
500.0000 mg | ORAL_TABLET | Freq: Every day | ORAL | Status: DC
Start: 2017-07-13 — End: 2017-07-15
  Administered 2017-07-13 – 2017-07-14 (×2): 500 mg via ORAL
  Filled 2017-07-13 (×2): qty 1

## 2017-07-13 MED ORDER — LEVOTHYROXINE SODIUM 100 MCG PO TABS
100.0000 ug | ORAL_TABLET | Freq: Every day | ORAL | Status: DC
  Administered 2017-07-14 – 2017-07-15 (×2): 100 ug via ORAL
  Filled 2017-07-13 (×2): qty 1

## 2017-07-13 MED ORDER — BENZONATATE 100 MG PO CAPS
100.0000 mg | ORAL_CAPSULE | Freq: Four times a day (QID) | ORAL | Status: DC | PRN
  Administered 2017-07-14: 100 mg via ORAL
  Filled 2017-07-13 (×2): qty 1

## 2017-07-13 MED ORDER — METHYLPREDNISOLONE SODIUM SUCC 125 MG IJ SOLR
125.0000 mg | Freq: Once | INTRAMUSCULAR | Status: AC
  Administered 2017-07-13: 125 mg via INTRAVENOUS
  Filled 2017-07-13: qty 2

## 2017-07-13 MED ORDER — METHYLPREDNISOLONE SODIUM SUCC 125 MG IJ SOLR
60.0000 mg | Freq: Four times a day (QID) | INTRAMUSCULAR | Status: DC
  Administered 2017-07-13 – 2017-07-14 (×2): 60 mg via INTRAVENOUS
  Filled 2017-07-13 (×2): qty 2

## 2017-07-13 MED ORDER — OXYCODONE-ACETAMINOPHEN 5-325 MG PO TABS
1.0000 | ORAL_TABLET | Freq: Four times a day (QID) | ORAL | Status: DC | PRN
  Administered 2017-07-13 – 2017-07-14 (×3): 1 via ORAL
  Filled 2017-07-13 (×3): qty 1

## 2017-07-13 MED ORDER — VALACYCLOVIR HCL 500 MG PO TABS
1000.0000 mg | ORAL_TABLET | Freq: Every day | ORAL | Status: DC | PRN
  Filled 2017-07-13: qty 2

## 2017-07-13 MED ORDER — TIOTROPIUM BROMIDE MONOHYDRATE 18 MCG IN CAPS
18.0000 ug | ORAL_CAPSULE | Freq: Every day | RESPIRATORY_TRACT | Status: DC
  Administered 2017-07-13 – 2017-07-15 (×2): 18 ug via RESPIRATORY_TRACT
  Filled 2017-07-13: qty 5

## 2017-07-13 NOTE — ED Provider Notes (Signed)
After 3 duo nebs and steroids patient has persistent inspiratory and expiratory wheezing.  She states she does not feel well enough to go home.  I will discussed with the hospitalist for observation.   Earleen Newport, MD 07/13/17 [phone]

## 2017-07-13 NOTE — H&P (Signed)
[place] [facility] [place] NAME: Brittney Chavez    MR#:  [id number]  DATE OF BIRTH:  [birth year]  DATE OF ADMISSION:  07/13/2017  PRIMARY CARE PHYSICIAN: Kallie Edward, MD   REQUESTING/REFERRING PHYSICIAN: Gwyndolyn Saxon  CHIEF COMPLAINT:   Chief Complaint  Patient presents with  . Chest Pain    HISTORY OF PRESENT ILLNESS: Brittney Chavez  is a 57 y.o. female with a known history of Anxiety, Hep C, Thyroid disease, active smoker- for last 1 week more SOB and Cough, on - off fever. Came to ER had COPD exacerbation and inspite of repeated nebs, no relief, so given for admission.  PAST MEDICAL HISTORY:   Past Medical History:  Diagnosis Date  . Abnormal Pap smear of cervix   . Anxiety   . Arthritis   . Barrett esophagus   . 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 Colon polyp    unc  . DDD (degenerative disc disease), lumbar   . GERD (gastroesophageal reflux disease)   . Hepatitis C   . Hepatitis C   . Hypothyroidism   . PTSD (post-traumatic stress disorder)   . Scoliosis   . Thyroid disease   . Torn ACL (anterior cruciate ligament)    Left Knee    PAST SURGICAL HISTORY:  Past Surgical History:  Procedure Laterality Date  . CERVICAL BIOPSY  W/ LOOP ELECTRODE EXCISION    . CERVICAL CONE BIOPSY    . CERVIX LESION DESTRUCTION    . DILATION AND CURETTAGE OF UTERUS     x 2  . LEEP N/A 06/04/2016   Procedure: LOOP ELECTROSURGICAL EXCISION PROCEDURE (LEEP);  Surgeon: Brayton Mars, MD;  Location: [facility] ORS;  Service: Gynecology;  Laterality: N/A;  . NECK SURGERY     Cervical Fusion  . THYROIDECTOMY, PARTIAL Bilateral   . TONSILLECTOMY     as a child  . TUBAL LIGATION      SOCIAL HISTORY:  Social History   Tobacco Use  . Smoking status: Current Every Day Smoker    Packs/day: 1.00    Years: 30.00    Pack years: 30.00    Types: Cigarettes  . Smokeless tobacco: Never Used  Substance Use Topics  . Alcohol use: No     FAMILY HISTORY:  Family History  Problem Relation Age of Onset  . Cancer Mother   . Diabetes Mother   . COPD Mother   . Breast cancer Mother 82  . COPD Father   . Stroke Father   . Polycystic ovary syndrome Daughter   . Birth defects Maternal Grandmother   . Colon cancer Maternal Grandmother   . Birth defects Paternal Grandmother   . Ovarian cancer Neg Hx   . Heart disease Neg Hx     DRUG ALLERGIES:  Allergies  Allergen Reactions  . Bee Pollen Shortness Of Breath and Swelling  . Nsaids Nausea Only    Other reaction(s): OTHER Blisters in mouth  . Pollen Extract     REVIEW OF SYSTEMS:   CONSTITUTIONAL: No fever, fatigue or weakness.  EYES: No blurred or double vision.  EARS, NOSE, AND THROAT: No tinnitus or ear pain.  RESPIRATORY: have cough, shortness of breath, wheezing ,no hemoptysis.  CARDIOVASCULAR: No chest pain, orthopnea, edema.  GASTROINTESTINAL: No nausea, vomiting, diarrhea or abdominal pain.  GENITOURINARY: No dysuria, hematuria.  ENDOCRINE: No polyuria, nocturia,  HEMATOLOGY: No anemia, easy bruising or bleeding SKIN: No rash or lesion. MUSCULOSKELETAL:  No joint pain or arthritis.   NEUROLOGIC: No tingling, numbness, weakness.  PSYCHIATRY: No anxiety or depression.   MEDICATIONS AT HOME:  Prior to Admission medications   Medication Sig Start Date End Date Taking? Authorizing Provider  acetaminophen (TYLENOL) 500 MG tablet Take 500 mg by mouth every 6 (six) hours as needed for mild pain.    Yes [provider]  albuterol (PROVENTIL HFA;VENTOLIN HFA) 108 (90 Base) MCG/ACT inhaler Inhale 2 puffs into the lungs every 6 (six) hours as needed for wheezing or shortness of breath. 03/26/17  Yes McGowan, Larene Beach A, PA-C  cetirizine (ZYRTEC) 10 MG tablet Take 1 tablet (10 mg total) by mouth daily. 03/26/17  Yes McGowan, Larene Beach A, PA-C  hydrochlorothiazide (HYDRODIURIL) 25 MG tablet Take 1 tablet (25 mg total) by mouth daily. 03/26/17  Yes McGowan,  Larene Beach A, PA-C  levothyroxine (SYNTHROID, LEVOTHROID) 100 MCG tablet TAKE ONE TABLET BY MOUTH EVERY DAY BEFORE BREAKFAST 07/01/17  Yes Tawni Millers, MD  omeprazole (PRILOSEC) 40 MG capsule Take 1 capsule (40 mg total) by mouth 2 (two) times daily. Patient taking differently: Take 40 mg by mouth daily.  03/26/17  Yes McGowan, Larene Beach A, PA-C  valACYclovir (VALTREX) 1000 MG tablet Take 1 tablet (1,000 mg total) by mouth daily as needed. FLARE UPS 03/26/17  Yes McGowan, Larene Beach A, PA-C  benzonatate (TESSALON PERLES) 100 MG capsule Take 1 capsule (100 mg total) by mouth every 6 (six) hours as needed for cough. Patient not taking: Reported on 03/26/2017 03/03/17 03/03/18  Nance Pear, MD  chlorpheniramine-HYDROcodone [facility] ER) 10-8 MG/5ML SUER Take 5 mLs by mouth 2 (two) times daily. Patient not taking: Reported on 07/13/2017 03/05/17   Lorin Picket, PA-C  cyclobenzaprine (FLEXERIL) 10 MG tablet Take 1 tablet (10 mg total) by mouth 3 (three) times daily. Patient not taking: Reported on 07/13/2017 03/26/17   Zara Council A, PA-C  doxycycline (VIBRAMYCIN) 100 MG capsule Take 1 capsule (100 mg total) by mouth 2 (two) times daily. Patient not taking: Reported on 03/26/2017 03/05/17   Lorin Picket, PA-C  gentamicin (GARAMYCIN) 0.3 % ophthalmic solution Place 2 drops into the right eye every 4 (four) hours. While awake Patient not taking: Reported on 07/13/2017 03/26/17   Zara Council A, PA-C  predniSONE (DELTASONE) 20 MG tablet Take 2 tablets (40 mg total) by mouth daily. Patient not taking: Reported on 03/26/2017 03/03/17   Nance Pear, MD  prochlorperazine (COMPAZINE) 10 MG tablet Take 1 tablet (10 mg total) by mouth every 6 (six) hours as needed for nausea or vomiting (headache). Patient not taking: Reported on 07/13/2017 03/03/17   Nance Pear, MD      PHYSICAL EXAMINATION:   VITAL SIGNS: Blood pressure 109/69, pulse 75, temperature 97.8 F (36.6 C), temperature  source Oral, resp. rate 17, height 5\' 4"  (1.626 m), weight 93.9 kg (207 lb), last menstrual period 03/07/2012, SpO2 97 %.  GENERAL:  57 y.o.-year-old patient lying in the bed with no acute distress.  EYES: Pupils equal, round, reactive to light and accommodation. No scleral icterus. Extraocular muscles intact.  HEENT: Head atraumatic, normocephalic. Oropharynx and nasopharynx clear.  NECK:  Supple, no jugular venous distention. No thyroid enlargement, no tenderness.  LUNGS: Normal breath sounds bilaterally, b/l wheezing, no crepitation. No use of accessory muscles of respiration.  CARDIOVASCULAR: S1, S2 normal. No murmurs, rubs, or gallops.  ABDOMEN: Soft, nontender, nondistended. Bowel sounds present. No organomegaly or mass.  EXTREMITIES: No pedal edema, cyanosis, or clubbing.  NEUROLOGIC:  Cranial nerves II through XII are intact. Muscle strength 5/5 in all extremities. Sensation intact. Gait not checked.  PSYCHIATRIC: The patient is alert and oriented x 3.  SKIN: No obvious rash, lesion, or ulcer.   LABORATORY PANEL:   CBC Recent Labs  Lab 07/13/17 1206  WBC 8.2  HGB 14.6  HCT 43.1  PLT 309  MCV 90.1  MCH 30.5  MCHC 33.8  RDW 14.3   ------------------------------------------------------------------------------------------------------------------  Chemistries  Recent Labs  Lab 07/13/17 1206  NA 139  K 3.8  CL 104  CO2 25  GLUCOSE 99  BUN 16  CREATININE 0.95  CALCIUM 9.0   ------------------------------------------------------------------------------------------------------------------ estimated creatinine clearance is 73.5 mL/min (by C-G formula based on SCr of 0.95 mg/dL). ------------------------------------------------------------------------------------------------------------------ No results for input(s): TSH, T4TOTAL, T3FREE, THYROIDAB in the last 72 hours.  Invalid input(s): FREET3   Coagulation profile No results for input(s): INR, PROTIME in the last  168 hours. ------------------------------------------------------------------------------------------------------------------- No results for input(s): DDIMER in the last 72 hours. -------------------------------------------------------------------------------------------------------------------  Cardiac Enzymes Recent Labs  Lab 07/13/17 1206  TROPONINI <0.03   ------------------------------------------------------------------------------------------------------------------ Invalid input(s): POCBNP  ---------------------------------------------------------------------------------------------------------------  Urinalysis    Component Value Date/Time   COLORURINE STRAW (A) 08/27/2015 1304   APPEARANCEUR CLEAR (A) 08/27/2015 1304   LABSPEC 1.002 (L) 08/27/2015 1304   PHURINE 6.0 08/27/2015 1304   GLUCOSEU NEGATIVE 08/27/2015 1304   HGBUR 1+ (A) 08/27/2015 1304   BILIRUBINUR NEGATIVE 08/27/2015 1304   KETONESUR NEGATIVE 08/27/2015 1304   PROTEINUR NEGATIVE 08/27/2015 1304   NITRITE NEGATIVE 08/27/2015 1304   LEUKOCYTESUR NEGATIVE 08/27/2015 1304     RADIOLOGY: Dg Chest 2 View  Result Date: 07/13/2017 CLINICAL DATA:  Shortness of breath and chest pain.  Cough. EXAM: CHEST  2 VIEW COMPARISON:  March 03, 2017 FINDINGS: There is no edema or consolidation. The heart size and pulmonary vascularity are normal. No adenopathy. There is aortic atherosclerosis. No bone lesions. IMPRESSION: Aortic atherosclerosis.  No edema or consolidation. Aortic Atherosclerosis (ICD10-I70.0). Electronically Signed   By: Lowella Grip III M.D.   On: 07/13/2017 12:46    EKG: Orders placed or performed during the hospital encounter of 07/13/17  . ED EKG within 10 minutes  . ED EKG within 10 minutes    IMPRESSION AND PLAN:  * Ac COPD exacerbation   IV and inhaled steroids, nebs bronchodilators   Spiriva   levaquin   Incentive spirometer  * Hypothyroidism   Levothyroxine  * Active  smoking   Counseled for 4 min, offered nicotine patch.  All the records are reviewed and case discussed with ED provider. Management plans discussed with the patient, family and they are in agreement.  CODE STATUS: Full. Code Status History    This patient does not have a recorded code status. Please follow your organizational policy for patients in this situation.       TOTAL TIME TAKING CARE OF THIS PATIENT: 45 minutes.    Vaughan Basta M.D on 07/13/2017   Between 7am to 6pm - Pager - [phone]  After 6pm go to [email or web address] - password [email or web address] [facility]  Office  [phone]  CC: Primary care physician; Kallie Edward, MD   Note: This dictation was prepared with Dragon dictation along with smaller phrase technology. Any transcriptional errors that result from this process are unintentional.

## 2017-07-13 NOTE — ED Triage Notes (Signed)
Pt came to ED via pov c/o cough, chest pain, sob, body aches x5 days, worsening the past 3. VS stable at this time. Afebrile in triage.

## 2017-07-13 NOTE — ED Provider Notes (Signed)
[facility] Emergency Department Provider Note ____________________________________________   I have reviewed the triage vital signs and the triage nursing note.  HISTORY  Chief Complaint Chest Pain   Historian Patient  HPI Brittney Chavez is a 57 y.o. female presents with shortness of breath and trouble breathing for about a week now.  She states that she has pro-air that she has been trying, but she does not give a history of known diagnosis of asthma or COPD.  States she is never been hospitalized for breathing.  Reports chills, subjective fevers at home.  Mild nausea.  She has had some watery diarrhea.  Symptoms are moderate.  Worse when she is lying flat at night and when she is walking around.   Past Medical History:  Diagnosis Date  . Abnormal Pap smear of cervix   . Anxiety   . Arthritis   . Barrett esophagus   . 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 Colon polyp    unc  . DDD (degenerative disc disease), lumbar   . GERD (gastroesophageal reflux disease)   . Hepatitis C   . Hepatitis C   . Hypothyroidism   . PTSD (post-traumatic stress disorder)   . Scoliosis   . Thyroid disease   . Torn ACL (anterior cruciate ligament)    Left Knee    Patient Active Problem List   Diagnosis Date Noted  . 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 Dysplasia of cervix 05/02/2016  . 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 Tobacco user 04/17/2016  . Vulvar lesion 04/17/2016  . Scoliosis 03/21/2016  . History of underactive thyroid 03/21/2016  . Dysphagia 03/21/2016    Past Surgical History:  Procedure Laterality Date  . CERVICAL BIOPSY  W/ LOOP ELECTRODE EXCISION    . CERVICAL CONE BIOPSY    . CERVIX LESION DESTRUCTION    . DILATION AND CURETTAGE OF UTERUS     x 2  . LEEP N/A 06/04/2016   Procedure: LOOP ELECTROSURGICAL EXCISION PROCEDURE (LEEP);  Surgeon: Brayton Mars, MD;  Location: [facility] ORS;  Service:  Gynecology;  Laterality: N/A;  . NECK SURGERY     Cervical Fusion  . THYROIDECTOMY, PARTIAL Bilateral   . TONSILLECTOMY     as a child  . TUBAL LIGATION      Prior to Admission medications   Medication Sig Start Date End Date Taking? Authorizing Provider  acetaminophen (TYLENOL) 500 MG tablet Take 500 mg by mouth every 6 (six) hours as needed for mild pain.    Yes [provider]  albuterol (PROVENTIL HFA;VENTOLIN HFA) 108 (90 Base) MCG/ACT inhaler Inhale 2 puffs into the lungs every 6 (six) hours as needed for wheezing or shortness of breath. 03/26/17  Yes McGowan, Larene Beach A, PA-C  cetirizine (ZYRTEC) 10 MG tablet Take 1 tablet (10 mg total) by mouth daily. 03/26/17  Yes McGowan, Larene Beach A, PA-C  hydrochlorothiazide (HYDRODIURIL) 25 MG tablet Take 1 tablet (25 mg total) by mouth daily. 03/26/17  Yes McGowan, Larene Beach A, PA-C  levothyroxine (SYNTHROID, LEVOTHROID) 100 MCG tablet TAKE ONE TABLET BY MOUTH EVERY DAY BEFORE BREAKFAST 07/01/17  Yes Tawni Millers, MD  omeprazole (PRILOSEC) 40 MG capsule Take 1 capsule (40 mg total) by mouth 2 (two) times daily. Patient taking differently: Take 40 mg by mouth daily.  03/26/17  Yes McGowan, Larene Beach A, PA-C  valACYclovir (VALTREX) 1000 MG tablet Take 1 tablet (1,000 mg total) by mouth daily as needed. FLARE  UPS 03/26/17  Yes McGowan, Larene Beach A, PA-C  benzonatate (TESSALON PERLES) 100 MG capsule Take 1 capsule (100 mg total) by mouth every 6 (six) hours as needed for cough. Patient not taking: Reported on 03/26/2017 03/03/17 03/03/18  Nance Pear, MD  chlorpheniramine-HYDROcodone [facility]) 10-8 MG/5ML SUER Take 5 mLs by mouth 2 (two) times daily. Patient not taking: Reported on 07/13/2017 03/05/17   Lorin Picket, PA-C  cyclobenzaprine (FLEXERIL) 10 MG tablet Take 1 tablet (10 mg total) by mouth 3 (three) times daily. Patient not taking: Reported on 07/13/2017 03/26/17   Zara Council A, PA-C  doxycycline (VIBRAMYCIN) 100 MG  capsule Take 1 capsule (100 mg total) by mouth 2 (two) times daily. Patient not taking: Reported on 03/26/2017 03/05/17   Lorin Picket, PA-C  gentamicin (GARAMYCIN) 0.3 % ophthalmic solution Place 2 drops into the right eye every 4 (four) hours. While awake Patient not taking: Reported on 07/13/2017 03/26/17   Zara Council A, PA-C  predniSONE (DELTASONE) 20 MG tablet Take 2 tablets (40 mg total) by mouth daily. Patient not taking: Reported on 03/26/2017 03/03/17   Nance Pear, MD  prochlorperazine (COMPAZINE) 10 MG tablet Take 1 tablet (10 mg total) by mouth every 6 (six) hours as needed for nausea or vomiting (headache). Patient not taking: Reported on 07/13/2017 03/03/17   Nance Pear, MD    Allergies  Allergen Reactions  . Bee Pollen Shortness Of Breath and Swelling  . Nsaids Nausea Only    Other reaction(s): OTHER Blisters in mouth  . Pollen Extract     Family History  Problem Relation Age of Onset  . Cancer Mother   . Diabetes Mother   . COPD Mother   . Breast cancer Mother 49  . COPD Father   . Stroke Father   . Polycystic ovary syndrome Daughter   . Birth defects Maternal Grandmother   . Colon cancer Maternal Grandmother   . Birth defects Paternal Grandmother   . Ovarian cancer Neg Hx   . Heart disease Neg Hx     Social History Social History   Tobacco Use  . Smoking status: Current Every Day Smoker    Packs/day: 1.00    Years: 30.00    Pack years: 30.00    Types: Cigarettes  . Smokeless tobacco: Never Used  Substance Use Topics  . Alcohol use: No  . Drug use: Yes    Types: Marijuana    Comment: 2 x a week    Review of Systems  Constitutional: Negative for fever. Eyes: Negative for visual changes. ENT: Negative for sore throat. Cardiovascular: Negative for chest pain. Respiratory: Positive for wheezing and for shortness of breath. Gastrointestinal: Negative for abdominal pain, vomiting and diarrhea. Genitourinary: Negative for  dysuria. Musculoskeletal: Negative for back pain. Skin: Negative for rash. Neurological: Negative for headache.  ____________________________________________   PHYSICAL EXAM:  VITAL SIGNS: ED Triage Vitals  Enc Vitals Group     BP 07/13/17 1204 (!) 139/98     Pulse Rate 07/13/17 1204 91     Resp 07/13/17 1204 18     Temp 07/13/17 1204 97.8 F (36.6 C)     Temp Source 07/13/17 1204 Oral     SpO2 07/13/17 1204 96 %     Weight 07/13/17 1204 207 lb (93.9 kg)     Height 07/13/17 1204 5\' 4"  (1.626 m)     Head Circumference --      Peak Flow --      Pain  Score 07/13/17 1500 8     Pain Loc --      Pain Edu? --      Excl. in [place]? --      Constitutional: Alert and oriented.  Moderate respiratory distress with wheezing, trouble speaking full sentences. HEENT   Head: Normocephalic and atraumatic.      Eyes: Conjunctivae are normal. Pupils equal and round.       Ears:         Nose: No congestion/rhinnorhea.   Mouth/Throat: Mucous membranes are moist.   Neck: No stridor. Cardiovascular/Chest: Normal rate, regular rhythm.  No murmurs, rubs, or gallops. Respiratory: Normal respiratory effort without tachypnea nor retractions.  She does have end expiratory wheezing in all fields which is moderate, limited to speaking just in short sentences. Gastrointestinal: Soft. No distention, no guarding, no rebound. Nontender.    Genitourinary/rectal:Deferred Musculoskeletal: Nontender with normal range of motion in all extremities. No joint effusions.  No lower extremity tenderness.  No edema. Neurologic:  Normal speech and language. No gross or focal neurologic deficits are appreciated. Skin:  Skin is warm, dry and intact. No rash noted. Psychiatric: Mood and affect are normal. Speech and behavior are normal. Patient exhibits appropriate insight and judgment.   ____________________________________________  LABS (pertinent positives/negatives) I, Lisa Roca, MD the attending  physician have reviewed the labs noted below.  Labs Reviewed  BASIC METABOLIC PANEL  CBC  TROPONIN I  INFLUENZA PANEL BY PCR (TYPE A & B)  POC URINE PREG, ED    ____________________________________________    EKG I, Lisa Roca, MD, the attending physician have personally viewed and interpreted all ECGs.  89 beats minute. normal sinus rhythm.  Narrow QS.  Normal axis.  Normal ST and T wave ____________________________________________  RADIOLOGY All Xrays were viewed by me.  Imaging interpreted by Radiologist, and I, Lisa Roca, MD the attending physician have reviewed the radiologist interpretation noted below.  Chest x-ray two-view:  IMPRESSION: Aortic atherosclerosis.  No edema or consolidation.  Aortic Atherosclerosis (ICD10-I70.0). __________________________________________  PROCEDURES  Procedure(s) performed: None  Critical Care performed: None   ____________________________________________  ED COURSE / ASSESSMENT AND PLAN  Pertinent labs & imaging results that were available during my care of the patient were reviewed by me and considered in my medical decision making (see chart for details).  Patient is not hypoxic, she however has significant dyspnea and wheezing on exam despite taking pro-air at home.  Sound like she may have had fevers at home as well, I am checking a flu swab.  Chest x-ray is clear for pneumonia.  Patient being tried on DuoNeb treatments here as well as Solu-Medrol.  Patient will be observed for short period here in the emergency department to see if she is able to improve enough to go home on oral steroids or if she will need hospital admission for treatment of what presumed to be COPD exacerbation without known diagnosis of COPD, will call acute bronchitis.  Patient care transferred to Dr. Jimmye Norman at shift change.  Patient awaiting reevaluation after DuoNeb treatment for decision of disc go home versus in hospital.  Flu test  pending  DIFFERENTIAL DIAGNOSIS: Differential includes, but is not limited to, viral syndrome, bronchitis including COPD exacerbation, pneumonia, reactive airway disease including asthma, CHF including exacerbation with or without pulmonary/interstitial edema, pneumothorax, ACS, thoracic trauma, and pulmonary embolism.  CONSULTATIONS: None  Patient / Family / Caregiver informed of clinical course, medical decision-making process, and agree with plan.   ___________________________________________  FINAL CLINICAL IMPRESSION(S) / ED DIAGNOSES   Final diagnoses:  Bronchospasm with bronchitis, acute      ___________________________________________        Note: This dictation was prepared with Dragon dictation. Any transcriptional errors that result from this process are unintentional    Lisa Roca, MD 07/13/17 1547

## 2017-07-14 LAB — CBC
HCT: 41.7 % (ref 35.0–47.0)
Hemoglobin: 13.8 g/dL (ref 12.0–16.0)
MCH: 30.2 pg (ref 26.0–34.0)
MCHC: 33.1 g/dL (ref 32.0–36.0)
MCV: 91.4 fL (ref 80.0–100.0)
PLATELETS: 294 10*3/uL (ref 150–440)
RBC: 4.56 MIL/uL (ref 3.80–5.20)
RDW: 14.7 % — AB (ref 11.5–14.5)
WBC: 7.3 10*3/uL (ref 3.6–11.0)

## 2017-07-14 LAB — BASIC METABOLIC PANEL
Anion gap: 11 (ref 5–15)
BUN: 21 mg/dL — AB (ref 6–20)
CALCIUM: 8.9 mg/dL (ref 8.9–10.3)
CHLORIDE: 103 mmol/L (ref 101–111)
CO2: 24 mmol/L (ref 22–32)
Creatinine, Ser: 1 mg/dL (ref 0.44–1.00)
GFR calc Af Amer: >60 mL/min (ref >60)
GFR calc non Af Amer: >60 mL/min (ref >60)
GLUCOSE: 148 mg/dL — AB (ref 65–99)
Potassium: 4.4 mmol/L (ref 3.5–5.1)
Sodium: 138 mmol/L (ref 135–145)

## 2017-07-14 LAB — MRSA PCR SCREENING: MRSA BY PCR: NEGATIVE

## 2017-07-14 MED ORDER — IPRATROPIUM-ALBUTEROL 0.5-2.5 (3) MG/3ML IN SOLN: 3.0000 mL | RESPIRATORY_TRACT | Status: DC | PRN

## 2017-07-14 MED ORDER — DM-GUAIFENESIN ER 30-600 MG PO TB12
1.0000 | ORAL_TABLET | Freq: Two times a day (BID) | ORAL | Status: DC
  Filled 2017-07-14: qty 1

## 2017-07-14 MED ORDER — HYDROCOD POLST-CPM POLST ER 10-8 MG/5ML PO SUER
5.0000 mL | Freq: Two times a day (BID) | ORAL | Status: DC | PRN
Start: 2017-07-14 — End: 2017-07-15
  Administered 2017-07-14 – 2017-07-15 (×2): 5 mL via ORAL
  Filled 2017-07-14 (×2): qty 5

## 2017-07-14 MED ORDER — GUAIFENESIN-DM 100-10 MG/5ML PO SYRP
15.0000 mL | ORAL_SOLUTION | Freq: Two times a day (BID) | ORAL | Status: DC
  Administered 2017-07-14 – 2017-07-15 (×3): 15 mL via ORAL
  Filled 2017-07-14 (×5): qty 15

## 2017-07-14 MED ORDER — METHYLPREDNISOLONE SODIUM SUCC 40 MG IJ SOLR
40.0000 mg | Freq: Two times a day (BID) | INTRAMUSCULAR | Status: DC
  Administered 2017-07-14 – 2017-07-15 (×2): 40 mg via INTRAVENOUS
  Filled 2017-07-14 (×2): qty 1

## 2017-07-14 MED ORDER — IPRATROPIUM-ALBUTEROL 0.5-2.5 (3) MG/3ML IN SOLN
3.0000 mL | Freq: Four times a day (QID) | RESPIRATORY_TRACT | Status: DC
  Administered 2017-07-14 – 2017-07-15 (×6): 3 mL via RESPIRATORY_TRACT
  Filled 2017-07-14 (×6): qty 3

## 2017-07-14 MED ORDER — GUAIFENESIN-DM 100-10 MG/5ML PO SYRP
5.0000 mL | ORAL_SOLUTION | ORAL | Status: DC | PRN
  Administered 2017-07-14: 03:00:00 5 mL via ORAL
  Filled 2017-07-14 (×2): qty 5

## 2017-07-14 MED ORDER — ORAL CARE MOUTH RINSE
15.0000 mL | Freq: Two times a day (BID) | OROMUCOSAL | Status: DC
  Administered 2017-07-14 – 2017-07-15 (×2): 15 mL via OROMUCOSAL

## 2017-07-14 NOTE — Plan of Care (Signed)
VSS, free of falls during shift.  Reported R abd pain 7-8/10, improved w/ PRN PO Percocet 5-325mg  x2.  Reported HA pain 8/10, relieved w/ PRN PO Tylenol 500mg .  Received PRN Tessalon 100mg  x1, PRN Robitussin DM 100-10mg  x1 for cough.  No other complaints overnight.  Oriented to unit, routine, call bell.  Bed in low position, call bell within reach.  WCTM.

## 2017-07-14 NOTE — Evaluation (Signed)
Physical Therapy Evaluation Patient Details Name: Brittney Chavez MRN: [id number] DOB: 1961/04/15 Today's Date: 07/14/2017   History of Present Illness  57 y/o female here with shortness of breath/COPD exacerbation.  Clinical Impression  Pt did well with PT exam showing good mobility, safety and confidence. She was able to walk 200 ft w/o AD and w/o LOBs or safety issues.  She had some fatigue and shortness of breath but O2 remained >95% on room air t/o the effort and apart from being a little more cautious (slower) than normal she feels close to baseline regarding ambulation, etc.  Pt does not have further PT needs, orders will be completed.    Follow Up Recommendations No PT follow up    Equipment Recommendations  None recommended by PT    Recommendations for Other Services       Precautions / Restrictions Precautions Precautions: Fall Restrictions Weight Bearing Restrictions: No      Mobility  Bed Mobility Overal bed mobility: Independent                Transfers Overall transfer level: Independent Equipment used: None                Ambulation/Gait Ambulation/Gait assistance: Modified independent (Device/Increase time) Ambulation Distance (Feet): 200 Feet Assistive device: None       General Gait Details: Pt with slow but confident with ambulation.  Her O2 remains >95% t/o the effort and HR remains stable (70s).  She had no LOBs and reports feeling close to her baseline apart from general shortness of breath.  Stairs            Wheelchair Mobility    Modified Rankin (Stroke Patients Only)       Balance Overall balance assessment: Independent                                           Pertinent Vitals/Pain Pain Assessment: No/denies pain    Home Living Family/patient expects to be discharged to:: Private residence Living Arrangements: Children;Other relatives Available Help at Discharge: Family   Home Access:  Stairs to enter   Entrance Stairs-Number of Steps: 2          Prior Function Level of Independence: Independent         Comments: Pt is generally able to do all she needs w/o issue     Hand Dominance        Extremity/Trunk Assessment   Upper Extremity Assessment Upper Extremity Assessment: Overall WFL for tasks assessed    Lower Extremity Assessment Lower Extremity Assessment: Overall WFL for tasks assessed       Communication   Communication: No difficulties  Cognition Arousal/Alertness: Awake/alert Behavior During Therapy: WFL for tasks assessed/performed Overall Cognitive Status: Within Functional Limits for tasks assessed                                        General Comments      Exercises     Assessment/Plan    PT Assessment Patent does not need any further PT services  PT Problem List         PT Treatment Interventions      PT Goals (Current goals can be found in the Care Plan section)  Acute Rehab PT Goals  Patient Stated Goal: go home PT Goal Formulation: All assessment and education complete, DC therapy    Frequency     Barriers to discharge        Co-evaluation               AM-PAC PT "6 Clicks" Daily Activity  Outcome Measure Difficulty turning over in bed (including adjusting bedclothes, sheets and blankets)?: None Difficulty moving from lying on back to sitting on the side of the bed? : None Difficulty sitting down on and standing up from a chair with arms (e.g., wheelchair, bedside commode, etc,.)?: None Help needed moving to and from a bed to chair (including a wheelchair)?: None Help needed walking in hospital room?: None Help needed climbing 3-5 steps with a railing? : None 6 Click Score: 24    End of Session Equipment Utilized During Treatment: Gait belt Activity Tolerance: Patient tolerated treatment well Patient left: with chair alarm set;with call bell/phone within reach Nurse Communication:  Mobility status PT Visit Diagnosis: Muscle weakness (generalized) (M62.81);Difficulty in walking, not elsewhere classified (R26.2)    Time: 1610-1630 PT Time Calculation (min) (ACUTE ONLY): 20 min   Charges:   PT Evaluation $PT Eval Low Complexity: 1 Low     PT G CodesKreg Shropshire, DPT 07/14/2017, 4:58 PM

## 2017-07-14 NOTE — Progress Notes (Signed)
Brittney Chavez at [place] NAME: Brittney Chavez    MR#:  [id number]  DATE OF BIRTH:  01/29/1961  SUBJECTIVE:   Patient was shortness of breath and wheezing this morning. Continues to have a cough  REVIEW OF SYSTEMS:    Review of Systems  Constitutional: Negative for fever, chills weight loss HENT: Negative for ear pain, nosebleeds, congestion, facial swelling, rhinorrhea, neck pain, neck stiffness and ear discharge.   Respiratory: Positive for cough, shortness of breath, wheezing  Cardiovascular: Negative for chest pain, palpitations and leg swelling.  Gastrointestinal: Negative for heartburn, abdominal pain, vomiting, diarrhea or consitpation Genitourinary: Negative for dysuria, urgency, frequency, hematuria Musculoskeletal: Negative for back pain or joint pain Neurological: Negative for dizziness, seizures, syncope, focal weakness,  numbness and headaches.  Hematological: Does not bruise/bleed easily.  Psychiatric/Behavioral: Negative for hallucinations, confusion, dysphoric mood    Tolerating Diet: Yes      DRUG ALLERGIES:   Allergies  Allergen Reactions  . Bee Pollen Shortness Of Breath and Swelling  . Nsaids Nausea Only    Other reaction(s): OTHER Blisters in mouth  . Pollen Extract     VITALS:  Blood pressure (!) 150/88, pulse 76, temperature 98.9 F (37.2 C), temperature source Oral, resp. rate (!) 22, height 5\' 4"  (1.626 m), weight 94 kg (207 lb 4.8 oz), last menstrual period 03/07/2012, SpO2 98 %.  PHYSICAL EXAMINATION:  Constitutional: Appears well-developed and well-nourished. No distress. HENT: Normocephalic. Marland Kitchen Oropharynx is clear and moist.  Eyes: Conjunctivae and EOM are normal. PERRLA, no scleral icterus.  Neck: Normal ROM. Neck supple. No JVD. No tracheal deviation. CVS: RRR, S1/S2 +, no murmurs, no gallops, no carotid bruit.  Pulmonary: Normal effort with prolonged expiratory wheezing bilaterally.  Abdominal:  Soft. BS +,  no distension, tenderness, rebound or guarding.  Musculoskeletal: Normal range of motion. No edema and no tenderness.  Neuro: Alert. CN 2-12 grossly intact. No focal deficits. Skin: Skin is warm and dry. No rash noted. Psychiatric: Normal mood and affect.      LABORATORY PANEL:   CBC Recent Labs  Lab 07/14/17 0416  WBC 7.3  HGB 13.8  HCT 41.7  PLT 294   ------------------------------------------------------------------------------------------------------------------  Chemistries  Recent Labs  Lab 07/14/17 0416  NA 138  K 4.4  CL 103  CO2 24  GLUCOSE 148*  BUN 21*  CREATININE 1.00  CALCIUM 8.9   ------------------------------------------------------------------------------------------------------------------  Cardiac Enzymes Recent Labs  Lab 07/13/17 1206  TROPONINI <0.03   ------------------------------------------------------------------------------------------------------------------  RADIOLOGY:  Dg Chest 2 View  Result Date: 07/13/2017 CLINICAL DATA:  Shortness of breath and chest pain.  Cough. EXAM: CHEST  2 VIEW COMPARISON:  March 03, 2017 FINDINGS: There is no edema or consolidation. The heart size and pulmonary vascularity are normal. No adenopathy. There is aortic atherosclerosis. No bone lesions. IMPRESSION: Aortic atherosclerosis.  No edema or consolidation. Aortic Atherosclerosis (ICD10-I70.0). Electronically Signed   By: Brittney Chavez M.D.   On: 07/13/2017 12:46     ASSESSMENT AND PLAN:   57 year old female with history of COPD and tobacco dependence who presents with shortness of breath.   1. Acute COPD exacerbation due to acute bronchitis Wean IV steroids to 40 mg IV every 12 hours Continue nebulizer and inhaler Continue Levaquin for acute bronchitis Add Tussionex for cough Add Mucinex Continue ISS  2. Hypothyroid: Continue Synthroid  3. Essential hypertension: Continue HCTZ  4. Tobacco dependence: Patient is  encouraged to quit smoking. Counseling was provided for  4 minutes.    Management plans discussed with the patient and she is in agreement.  CODE STATUS: full  TOTAL TIME TAKING CARE OF THIS PATIENT: 30 minutes.     POSSIBLE D/C 1-2 days, DEPENDING ON CLINICAL CONDITION.   Brittney Chavez M.D on 07/14/2017 at 8:11 AM  Between 7am to 6pm - Pager - [phone] After 6pm go to [email or web address] - password [email or web address] [facility]  Office  [phone]  CC: Primary care physician; Brittney Edward, MD  Note: This dictation was prepared with Dragon dictation along with smaller phrase technology. Any transcriptional errors that result from this process are unintentional.

## 2017-07-14 NOTE — Progress Notes (Signed)
Pt complaining of chest pain to her R lower chest. She states that it is worse with coughing and deep breaths. She reports SOB and lightheadedness at times. Dr. Benjie Karvonen notified. No new orders, but to notify her if the pt has another episode of chest pain. PRN percocet administered.

## 2017-07-15 LAB — HIV ANTIBODY (ROUTINE TESTING W REFLEX): HIV Screen 4th Generation wRfx: NONREACTIVE

## 2017-07-15 MED ORDER — PREDNISONE 20 MG PO TABS: 40.0000 mg | ORAL_TABLET | Freq: Every day | ORAL | 0 refills | Status: AC

## 2017-07-15 MED ORDER — TIOTROPIUM BROMIDE MONOHYDRATE 18 MCG IN CAPS: 18.0000 ug | ORAL_CAPSULE | Freq: Every day | RESPIRATORY_TRACT | 12 refills | Status: DC

## 2017-07-15 MED ORDER — HYDROCOD POLST-CPM POLST ER 10-8 MG/5ML PO SUER: 5.0000 mL | Freq: Two times a day (BID) | ORAL | 0 refills | Status: DC | PRN

## 2017-07-15 MED ORDER — LEVOFLOXACIN 500 MG PO TABS: 500.0000 mg | ORAL_TABLET | Freq: Every day | ORAL | 0 refills | Status: AC

## 2017-07-15 NOTE — Progress Notes (Signed)
SATURATION QUALIFICATIONS: (This note is used to comply with regulatory documentation for home oxygen)  Patient Saturations on Room Air at Rest = 87%  Patient Saturations on Room Air while Ambulating = 85%  Patient Saturations on 2 Liters of oxygen while Ambulating = 94%  Please briefly explain why patient needs home oxygen: COPD

## 2017-07-15 NOTE — Progress Notes (Signed)
Pt is being discharged today, IV removed, home 02 tank was delivered to her room, discharge instructions reviewed with her. She verified understanding of all instructions. 4 paper prescriptions were given to her. All belongings were packed and returned to the patient and she was rolled out in a wheelchair by staff.

## 2017-07-15 NOTE — Discharge Summary (Signed)
[place] [facility] [place] NAME: Brittney Chavez    MR#:  [id number]  DATE OF BIRTH:  May 27, 1961  DATE OF ADMISSION:  07/13/2017 ADMITTING PHYSICIAN: Vaughan Basta, MD  DATE OF DISCHARGE: 07/15/2017  PRIMARY CARE PHYSICIAN: Kallie Edward, MD    ADMISSION DIAGNOSIS:  Bronchospasm with bronchitis, acute [J20.9]  DISCHARGE DIAGNOSIS:  Principal Problem:   COPD with acute exacerbation ([place]) Active Problems:   COPD exacerbation ([place])   SECONDARY DIAGNOSIS:   Past Medical History:  Diagnosis Date  . Abnormal Pap smear of cervix   . Anxiety   . Arthritis   . Barrett esophagus   . 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 Colon polyp    unc  . DDD (degenerative disc disease), lumbar   . GERD (gastroesophageal reflux disease)   . Hepatitis C   . Hepatitis C   . Hypothyroidism   . PTSD (post-traumatic stress disorder)   . Scoliosis   . Thyroid disease   . Torn ACL (anterior cruciate ligament)    Left Knee    HOSPITAL COURSE:  56 year old female with history of COPD and tobacco dependence who presents with shortness of breath.   1. Acute COPD exacerbation due to acute bronchitis Patient symptoms have improved. She will continue on prednisone and Levaquin for treatment of acute bursitis. She was discharged with long-acting inhaler. She will need follow-up with PCP in one week. She will also require oxygen and an outpatient sleep evaluation for possible underlying OSA.   2. Hypothyroid: Continue Synthroid  3. Essential hypertension: Continue HCTZ  4. Tobacco dependence: Patient is encouraged to quit smoking. Counseling was provided for 4 minutes.      DISCHARGE CONDITIONS AND DIET:   Stable for discharge on regular diet  CONSULTS OBTAINED:    DRUG ALLERGIES:   Allergies  Allergen Reactions  . Bee Pollen Shortness Of Breath and Swelling  . Nsaids Nausea Only    Other reaction(s): OTHER Blisters in  mouth  . Pollen Extract     DISCHARGE MEDICATIONS:   Allergies as of 07/15/2017      Reactions   Bee Pollen Shortness Of Breath, Swelling   Nsaids Nausea Only   Other reaction(s): OTHER Blisters in mouth   Pollen Extract       Medication List    STOP taking these medications   benzonatate 100 MG capsule Commonly known as:  TESSALON PERLES   doxycycline 100 MG capsule Commonly known as:  VIBRAMYCIN   gentamicin 0.3 % ophthalmic solution Commonly known as:  GARAMYCIN   prochlorperazine 10 MG tablet Commonly known as:  COMPAZINE     TAKE these medications   acetaminophen 500 MG tablet Commonly known as:  TYLENOL Take 500 mg by mouth every 6 (six) hours as needed for mild pain.   albuterol 108 (90 Base) MCG/ACT inhaler Commonly known as:  PROVENTIL HFA;VENTOLIN HFA Inhale 2 puffs into the lungs every 6 (six) hours as needed for wheezing or shortness of breath.   cetirizine 10 MG tablet Commonly known as:  ZYRTEC Take 1 tablet (10 mg total) by mouth daily.   chlorpheniramine-HYDROcodone 10-8 MG/5ML Suer Commonly known as:  TUSSIONEX Take 5 mLs by mouth every 12 (twelve) hours as needed for cough. What changed:    when to take this  reasons to take this   cyclobenzaprine 10 MG tablet Commonly known as:  FLEXERIL Take 1 tablet (10 mg total) by mouth 3 (three)  times daily.   hydrochlorothiazide 25 MG tablet Commonly known as:  HYDRODIURIL Take 1 tablet (25 mg total) by mouth daily.   levofloxacin 500 MG tablet Commonly known as:  LEVAQUIN Take 1 tablet (500 mg total) by mouth daily for 3 days.   levothyroxine 100 MCG tablet Commonly known as:  SYNTHROID, LEVOTHROID TAKE ONE TABLET BY MOUTH EVERY DAY BEFORE BREAKFAST   omeprazole 40 MG capsule Commonly known as:  PRILOSEC Take 1 capsule (40 mg total) by mouth 2 (two) times daily. What changed:  when to take this   predniSONE 20 MG tablet Commonly known as:  DELTASONE Take 2 tablets (40 mg total) by  mouth daily with breakfast for 4 days. What changed:  when to take this   tiotropium 18 MCG inhalation capsule Commonly known as:  SPIRIVA Place 1 capsule (18 mcg total) into inhaler and inhale daily.   valACYclovir 1000 MG tablet Commonly known as:  VALTREX Take 1 tablet (1,000 mg total) by mouth daily as needed. [place]  (From admission, onward)        Start     Ordered   07/15/17 0814  For home use only DME oxygen  Once    Question Answer Comment  Mode or (Route) Nasal cannula   Liters per Minute 2   Frequency Only at night (stationary unit needed)   Oxygen conserving device Yes   Oxygen delivery system Gas      07/15/17 0813        Today   CHIEF COMPLAINT:   Patient doing better this morning. Still has a cough but shortness of breath and wheezing have improved. Oxygen level decreased at nighttime however was doing well during the day.   VITAL SIGNS:  Blood pressure 131/72, pulse 80, temperature (!) 97.4 F (36.3 C), temperature source Oral, resp. rate 20, height 5\' 4"  (1.626 m), weight 94 kg (207 lb 4.8 oz), last menstrual period 03/07/2012, SpO2 94 %.   REVIEW OF SYSTEMS:  Review of Systems  Constitutional: Negative.  Negative for chills, fever and malaise/fatigue.  HENT: Negative.  Negative for ear discharge, ear pain, hearing loss, nosebleeds and sore throat.   Eyes: Negative.  Negative for blurred vision and pain.  Respiratory: Positive for cough. Negative for hemoptysis, shortness of breath and wheezing.   Cardiovascular: Negative.  Negative for chest pain, palpitations and leg swelling.  Gastrointestinal: Negative.  Negative for abdominal pain, blood in stool, diarrhea, nausea and vomiting.  Genitourinary: Negative.  Negative for dysuria.  Musculoskeletal: Negative.  Negative for back pain.  Skin: Negative.   Neurological: Negative for dizziness, tremors, speech change, focal weakness, seizures and  headaches.  Endo/Heme/Allergies: Negative.  Does not bruise/bleed easily.  Psychiatric/Behavioral: Negative.  Negative for depression, hallucinations and suicidal ideas.     PHYSICAL EXAMINATION:  GENERAL:  57 y.o.-year-old patient lying in the bed with no acute distress.  NECK:  Supple, no jugular venous distention. No thyroid enlargement, no tenderness.  LUNGS: Normal breath sounds bilaterally, no wheezing, rales,rhonchi  No use of accessory muscles of respiration.  CARDIOVASCULAR: S1, S2 normal. No murmurs, rubs, or gallops.  ABDOMEN: Soft, non-tender, non-distended. Bowel sounds present. No organomegaly or mass.  EXTREMITIES: No pedal edema, cyanosis, or clubbing.  PSYCHIATRIC: The patient is alert and oriented x 3.  SKIN: No obvious rash, lesion, or ulcer.   DATA REVIEW:   CBC Recent Labs  Lab 07/14/17 0416  WBC 7.3  HGB 13.8  HCT 41.7  PLT 294    Chemistries  Recent Labs  Lab 07/14/17 0416  NA 138  K 4.4  CL 103  CO2 24  GLUCOSE 148*  BUN 21*  CREATININE 1.00  CALCIUM 8.9    Cardiac Enzymes Recent Labs  Lab 07/13/17 1206  TROPONINI <0.03    Microbiology Results  @MICRORSLT48 @  RADIOLOGY:  Dg Chest 2 View  Result Date: 07/13/2017 CLINICAL DATA:  Shortness of breath and chest pain.  Cough. EXAM: CHEST  2 VIEW COMPARISON:  March 03, 2017 FINDINGS: There is no edema or consolidation. The heart size and pulmonary vascularity are normal. No adenopathy. There is aortic atherosclerosis. No bone lesions. IMPRESSION: Aortic atherosclerosis.  No edema or consolidation. Aortic Atherosclerosis (ICD10-I70.0). Electronically Signed   By: Lowella Grip III M.D.   On: 07/13/2017 12:46      Allergies as of 07/15/2017      Reactions   Bee Pollen Shortness Of Breath, Swelling   Nsaids Nausea Only   Other reaction(s): OTHER Blisters in mouth   Pollen Extract       Medication List    STOP taking these medications   benzonatate 100 MG capsule Commonly  known as:  TESSALON PERLES   doxycycline 100 MG capsule Commonly known as:  VIBRAMYCIN   gentamicin 0.3 % ophthalmic solution Commonly known as:  GARAMYCIN   prochlorperazine 10 MG tablet Commonly known as:  COMPAZINE     TAKE these medications   acetaminophen 500 MG tablet Commonly known as:  TYLENOL Take 500 mg by mouth every 6 (six) hours as needed for mild pain.   albuterol 108 (90 Base) MCG/ACT inhaler Commonly known as:  PROVENTIL HFA;VENTOLIN HFA Inhale 2 puffs into the lungs every 6 (six) hours as needed for wheezing or shortness of breath.   cetirizine 10 MG tablet Commonly known as:  ZYRTEC Take 1 tablet (10 mg total) by mouth daily.   chlorpheniramine-HYDROcodone 10-8 MG/5ML Suer Commonly known as:  TUSSIONEX Take 5 mLs by mouth every 12 (twelve) hours as needed for cough. What changed:    when to take this  reasons to take this   cyclobenzaprine 10 MG tablet Commonly known as:  FLEXERIL Take 1 tablet (10 mg total) by mouth 3 (three) times daily.   hydrochlorothiazide 25 MG tablet Commonly known as:  HYDRODIURIL Take 1 tablet (25 mg total) by mouth daily.   levofloxacin 500 MG tablet Commonly known as:  LEVAQUIN Take 1 tablet (500 mg total) by mouth daily for 3 days.   levothyroxine 100 MCG tablet Commonly known as:  SYNTHROID, LEVOTHROID TAKE ONE TABLET BY MOUTH EVERY DAY BEFORE BREAKFAST   omeprazole 40 MG capsule Commonly known as:  PRILOSEC Take 1 capsule (40 mg total) by mouth 2 (two) times daily. What changed:  when to take this   predniSONE 20 MG tablet Commonly known as:  DELTASONE Take 2 tablets (40 mg total) by mouth daily with breakfast for 4 days. What changed:  when to take this   tiotropium 18 MCG inhalation capsule Commonly known as:  SPIRIVA Place 1 capsule (18 mcg total) into inhaler and inhale daily.   valACYclovir 1000 MG tablet Commonly known as:  VALTREX Take 1 tablet (1,000 mg total) by mouth daily as needed. FLARE  UPS            Durable Medical Equipment  (From admission, onward)        Start  Ordered   07/15/17 0814  For home use only DME oxygen  Once    Question Answer Comment  Mode or (Route) Nasal cannula   Liters per Minute 2   Frequency Only at night (stationary unit needed)   Oxygen conserving device Yes   Oxygen delivery system Gas      07/15/17 0813         Management plans discussed with the patient and she is in agreement. Stable for discharge home  Patient should follow up with pcp  CODE STATUS:     Code Status Orders  (From admission, onward)        Start     Ordered   07/13/17 1951  Full code  Continuous     07/13/17 1951    Code Status History    Date Active Date Inactive Code Status Order ID Comments User Context   This patient has a current code status but no historical code status.      TOTAL TIME TAKING CARE OF THIS PATIENT: 38 minutes.    Note: This dictation was prepared with Dragon dictation along with smaller phrase technology. Any transcriptional errors that result from this process are unintentional.  Sequoia Witz M.D on 07/15/2017 at 8:16 AM  Between 7am to 6pm - Pager - [phone] After 6pm go to [email or web address] - password [email or web address] [facility]  Office  [phone]  CC: Primary care physician; Kallie Edward, MD

## 2017-07-15 NOTE — Progress Notes (Signed)
Patient was weaned off oxygen by RT Bob during the 0200 hour. Patient desated shortly after. Patient placed on 2L via at this time. Endorse to Public house manager.

## 2017-07-15 NOTE — Care Management (Signed)
Qualifies for Home oxygen. [employer]. [facility] representative updated.  Discharge to home today per Dr. Benjie Karvonen [phone]

## 2017-07-22 ENCOUNTER — Encounter: Payer: Self-pay | Admitting: *Deleted

## 2017-07-22 ENCOUNTER — Ambulatory Visit
Admission: RE | Admit: 2017-07-22 | Discharge: 2017-07-22 | Disposition: A | Discharge status: Home / Self Care | Payer: Self-pay | Source: Ambulatory Visit | Attending: Oncology | Admitting: Oncology

## 2017-07-22 ENCOUNTER — Ambulatory Visit: Payer: Self-pay | Attending: Oncology | Admitting: *Deleted

## 2017-07-22 VITALS — BP 118/80 | HR 80 | Temp 96.4°F | Ht 67.0 in | Wt 206.0 lb

## 2017-07-22 DIAGNOSIS — Z0000 Encounter for general adult medical examination without abnormal findings: Secondary | ICD-10-CM

## 2017-07-22 NOTE — Progress Notes (Signed)
Subjective:     Patient ID: ANIYIAH ZELL, female   DOB: 08/17/1960, 57 y.o.   MRN: [id number]  HPI   Review of Systems     Objective:   Physical Exam  Pulmonary/Chest: Right breast exhibits no inverted nipple, no mass, no nipple discharge, no skin change and no tenderness. Left breast exhibits no inverted nipple, no mass, no nipple discharge, no skin change and no tenderness. Breasts are symmetrical.       Assessment:     57 year old White female returns to [facility] for [facility] screening.  Patient is currently being followed for abnormal pap smears and VIN.  Last colpo and biopsy with Dr. Enzo Bi was 02/12/17 with benign findings.  He has recommended a 6 month follow-up and colpo.  Patient is scheduled to see him on 08/15/17 @ 9:20.  She was reminded of her appointment.  Clinical breast exam unremarkable.  Taught self breast awareness.  Patient has been screened for eligibility.  She does not have any insurance, Medicare or Medicaid.  She also meets financial eligibility.  Hand-out given on the Affordable Care Act.    Plan:     Screening mammogram ordered.  Follow up with Dr. Enzo Bi on 08/15/17.  Will follow-up per [facility] protocol.

## 2017-07-23 ENCOUNTER — Ambulatory Visit: Payer: PRIVATE HEALTH INSURANCE | Admitting: Adult Health Nurse Practitioner

## 2017-07-23 VITALS — BP 119/84 | HR 98 | Wt 206.5 lb

## 2017-07-23 DIAGNOSIS — Z720 Tobacco use: Secondary | ICD-10-CM

## 2017-07-23 DIAGNOSIS — E039 Hypothyroidism, unspecified: Secondary | ICD-10-CM

## 2017-07-23 DIAGNOSIS — J441 Chronic obstructive pulmonary disease with (acute) exacerbation: Secondary | ICD-10-CM

## 2017-07-23 MED ORDER — PREDNISONE 20 MG PO TABS: 40.0000 mg | ORAL_TABLET | Freq: Every day | ORAL | 5 refills | Status: DC

## 2017-07-23 MED ORDER — AZITHROMYCIN 250 MG PO TABS: ORAL_TABLET | ORAL | 0 refills | Status: DC

## 2017-07-23 NOTE — Progress Notes (Signed)
Subjective:    Patient ID: Brittney Chavez, female    DOB: [birth year], 57 y.o.   MRN: [id number]  HPI   Brittney Chavez is 57 yo female here for f/u from hospitalization on 1/19-1/21 for COPD acute exacerbation, secondary to bronchitis. Discharged on prednisone and levaquin and spiriva inhaler.  Pt reports she feels like she is getting sick again. She denies fever. Endorses headaches, chills, nausea, and diarrhea. Pt reports she received an O2 concentrator to sleep at night and she has not been wearing O2 during the day. She endorses congestion.  Pt reports chest and jaw pain in the hospital.   EKG reviewed.    Patient Active Problem List   Diagnosis Date Noted  . COPD with acute exacerbation ([place]) 07/13/2017  . COPD exacerbation ([place]) 07/13/2017  . 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 Dysplasia of cervix 05/02/2016  . 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 Tobacco user 04/17/2016  . Vulvar lesion 04/17/2016  . Scoliosis 03/21/2016  . History of underactive thyroid 03/21/2016  . Dysphagia 03/21/2016   Allergies as of 07/23/2017      Reactions   Bee Pollen Shortness Of Breath, Swelling   Nsaids Nausea Only   Other reaction(s): OTHER Blisters in mouth   Pollen Extract       Medication List        Accurate as of 07/23/17  7:36 PM. Always use your most recent med list.          acetaminophen 500 MG tablet Commonly known as:  TYLENOL Take 500 mg by mouth every 6 (six) hours as needed for mild pain.   albuterol 108 (90 Base) MCG/ACT inhaler Commonly known as:  PROVENTIL HFA;VENTOLIN HFA Inhale 2 puffs into the lungs every 6 (six) hours as needed for wheezing or shortness of breath.   cetirizine 10 MG tablet Commonly known as:  ZYRTEC Take 1 tablet (10 mg total) by mouth daily.   chlorpheniramine-HYDROcodone 10-8 MG/5ML Suer Commonly known as:  TUSSIONEX Take 5 mLs by mouth every 12 (twelve) hours as needed for cough.   cyclobenzaprine 10 MG  tablet Commonly known as:  FLEXERIL Take 1 tablet (10 mg total) by mouth 3 (three) times daily.   hydrochlorothiazide 25 MG tablet Commonly known as:  HYDRODIURIL Take 1 tablet (25 mg total) by mouth daily.   levothyroxine 100 MCG tablet Commonly known as:  SYNTHROID, LEVOTHROID TAKE ONE TABLET BY MOUTH EVERY DAY BEFORE BREAKFAST   omeprazole 40 MG capsule Commonly known as:  PRILOSEC Take 1 capsule (40 mg total) by mouth 2 (two) times daily.   tiotropium 18 MCG inhalation capsule Commonly known as:  SPIRIVA Place 1 capsule (18 mcg total) into inhaler and inhale daily.   valACYclovir 1000 MG tablet Commonly known as:  VALTREX Take 1 tablet (1,000 mg total) by mouth daily as needed. [employer]        Review of Systems Pt reports she has finished her prednisone and levaquin - had a 3 days post hospital. Pt reports she is trying to quite smoking.     Objective:   Physical Exam  Constitutional: She is oriented to person, place, and time. She appears well-developed and well-nourished.  Cardiovascular: Normal rate, regular rhythm and normal heart sounds.  Pulmonary/Chest: Effort normal and breath sounds normal.  Abdominal: Soft. Bowel sounds are normal.  Neurological: She is alert and oriented to person, place, and time.  Vitals reviewed.    BP 119/84   Pulse  98   Wt 206 lb 8 oz (93.7 kg)   LMP 03/07/2012 (Approximate)   BMI 32.34 kg/m        Assessment & Plan:   Routine labs today.  Rx 20 mg prednisone BID for 5 days and a Z-pak.  Referral to [facility] pulmonology. F/u in 2 weeks. Encouraged smoking cessation.

## 2017-07-24 LAB — CBC
HEMOGLOBIN: 15.3 g/dL (ref 11.1–15.9)
Hematocrit: 45.3 % (ref 34.0–46.6)
MCH: 31.2 pg (ref 26.6–33.0)
MCHC: 33.8 g/dL (ref 31.5–35.7)
MCV: 92 fL (ref 79–97)
Platelets: 397 10*3/uL — ABNORMAL HIGH (ref 150–379)
RBC: 4.91 x10E6/uL (ref 3.77–5.28)
RDW: 14.4 % (ref 12.3–15.4)
WBC: 16.5 10*3/uL — ABNORMAL HIGH (ref 3.4–10.8)

## 2017-07-24 LAB — BASIC METABOLIC PANEL
BUN / CREAT RATIO: 21 (ref 9–23)
BUN: 17 mg/dL (ref 6–24)
CO2: 23 mmol/L (ref 20–29)
CREATININE: 0.81 mg/dL (ref 0.57–1.00)
Calcium: 10.2 mg/dL (ref 8.7–10.2)
Chloride: 101 mmol/L (ref 96–106)
GFR calc non Af Amer: 81 mL/min/{1.73_m2} (ref >59)
GFR, EST AFRICAN AMERICAN: 94 mL/min/{1.73_m2} (ref >59)
GLUCOSE: 91 mg/dL (ref 65–99)
Potassium: 5.1 mmol/L (ref 3.5–5.2)
SODIUM: 144 mmol/L (ref 134–144)

## 2017-07-24 LAB — LIPID PANEL
CHOL/HDL RATIO: 6.4 ratio — AB (ref 0.0–4.4)
Cholesterol, Total: 300 mg/dL — ABNORMAL HIGH (ref 100–199)
HDL: 47 mg/dL (ref >39)
Triglycerides: 706 mg/dL (ref 0–149)

## 2017-07-24 LAB — TSH: TSH: 10.45 u[IU]/mL — ABNORMAL HIGH (ref 0.450–4.500)

## 2017-07-25 ENCOUNTER — Other Ambulatory Visit: Payer: Self-pay | Admitting: Adult Health Nurse Practitioner

## 2017-07-25 DIAGNOSIS — D72829 Elevated white blood cell count, unspecified: Secondary | ICD-10-CM

## 2017-07-25 MED ORDER — OMEPRAZOLE 20 MG PO CPDR
20.0000 mg | DELAYED_RELEASE_CAPSULE | Freq: Every day | ORAL | 3 refills | Status: DC
Start: 2017-07-25 — End: 2017-10-30

## 2017-08-06 ENCOUNTER — Ambulatory Visit: Payer: PRIVATE HEALTH INSURANCE

## 2017-08-06 ENCOUNTER — Other Ambulatory Visit: Payer: PRIVATE HEALTH INSURANCE

## 2017-08-06 DIAGNOSIS — D72829 Elevated white blood cell count, unspecified: Secondary | ICD-10-CM

## 2017-08-07 LAB — CBC WITH DIFFERENTIAL/PLATELET
BASOS ABS: 0.1 10*3/uL (ref 0.0–0.2)
BASOS: 1 %
EOS (ABSOLUTE): 0.3 10*3/uL (ref 0.0–0.4)
Eos: 2 %
Hematocrit: 41.4 % (ref 34.0–46.6)
Hemoglobin: 13.5 g/dL (ref 11.1–15.9)
Immature Grans (Abs): 0 10*3/uL (ref 0.0–0.1)
Immature Granulocytes: 0 %
LYMPHS: 34 %
Lymphocytes Absolute: 3.7 10*3/uL — ABNORMAL HIGH (ref 0.7–3.1)
MCH: 29.8 pg (ref 26.6–33.0)
MCHC: 32.6 g/dL (ref 31.5–35.7)
MCV: 91 fL (ref 79–97)
MONOS ABS: 0.9 10*3/uL (ref 0.1–0.9)
Monocytes: 8 %
NEUTROS ABS: 6.2 10*3/uL (ref 1.4–7.0)
Neutrophils: 55 %
PLATELETS: 321 10*3/uL (ref 150–379)
RBC: 4.53 x10E6/uL (ref 3.77–5.28)
RDW: 14.2 % (ref 12.3–15.4)
WBC: 11.1 10*3/uL — ABNORMAL HIGH (ref 3.4–10.8)

## 2017-08-08 ENCOUNTER — Ambulatory Visit: Payer: PRIVATE HEALTH INSURANCE | Admitting: Internal Medicine

## 2017-08-08 VITALS — BP 123/88 | HR 84 | Wt 207.7 lb

## 2017-08-08 DIAGNOSIS — Z720 Tobacco use: Secondary | ICD-10-CM

## 2017-08-08 DIAGNOSIS — J441 Chronic obstructive pulmonary disease with (acute) exacerbation: Secondary | ICD-10-CM

## 2017-08-08 NOTE — Patient Instructions (Signed)
Waiting on [facility] pulmonology referral. Interested in referral to pain management but needs referral from PCP. Continue efforts to reduce/quit smoking followup in 2 months

## 2017-08-08 NOTE — Progress Notes (Signed)
Patient: Brittney Chavez Female    DOB: [birth year]   57 y.o.   MRN: [id number] Visit Date: 08/08/2017  Today's Provider: Wynona Luna, MD   Chief Complaint  Patient presents with  . Follow-up    lab results for pulmonary referral   . Nicotine Dependence    would like to discuss quitting    Subjective:    HPI patient was here couple weeks ago with COPD exacerbation, treated with some prednisone and a Z-Pak.  Some improvement, still with some exertional breathlessness, stable.  Using home O2 at night, definitely has less headache and more energy the next day after using home O2.  Trying to quit smoking, had some difficulty with the gum and the patch is giving her nausea.  She is down at times to a 30 pack/day.  Has not heard from the [facility] pulmonology clinic yet. She would like a referral to the pain clinic, but was told by the pain clinic that she needs a primary care provider to refer her.      Allergies  Allergen Reactions  . Bee Pollen Shortness Of Breath and Swelling  . Gabapentin Other (See Comments)    Causes confusion  . Nsaids Nausea Only    Other reaction(s): OTHER Blisters in mouth  . Pollen Extract    Previous Medications   ACETAMINOPHEN (TYLENOL) 500 MG TABLET    Take 500 mg by mouth every 6 (six) hours as needed for mild pain.    ALBUTEROL (PROVENTIL HFA;VENTOLIN HFA) 108 (90 BASE) MCG/ACT INHALER    Inhale 2 puffs into the lungs every 6 (six) hours as needed for wheezing or shortness of breath.   AZITHROMYCIN (ZITHROMAX) 250 MG TABLET    Take two tablets day 1 and then one tablet every day after until gone.   CETIRIZINE (ZYRTEC) 10 MG TABLET    Take 1 tablet (10 mg total) by mouth daily.   CHLORPHENIRAMINE-HYDROCODONE (TUSSIONEX) 10-8 MG/5ML SUER    Take 5 mLs by mouth every 12 (twelve) hours as needed for cough.   HYDROCHLOROTHIAZIDE (HYDRODIURIL) 25 MG TABLET    Take 1 tablet (25 mg total) by mouth daily.   LEVOTHYROXINE (SYNTHROID, LEVOTHROID) 100 MCG TABLET     TAKE ONE TABLET BY MOUTH EVERY DAY BEFORE BREAKFAST   OMEPRAZOLE (PRILOSEC) 20 MG CAPSULE    Take 1 capsule (20 mg total) by mouth daily.   PREDNISONE (DELTASONE) 20 MG TABLET    Take 2 tablets (40 mg total) by mouth daily with breakfast.   TIOTROPIUM (SPIRIVA) 18 MCG INHALATION CAPSULE    Place 1 capsule (18 mcg total) into inhaler and inhale daily.   VALACYCLOVIR (VALTREX) 1000 MG TABLET    Take 1 tablet (1,000 mg total) by mouth daily as needed. FLARE [employer]    Review of Systems  All other systems reviewed and are negative.   Social History   Tobacco Use  . Smoking status: Current Every Day Smoker    Packs/day: 0.25    Years: 30.00    Pack years: 7.50    Types: Cigarettes  . Smokeless tobacco: Never Used  Substance Use Topics  . Alcohol use: No   Objective:   BP 123/88 (BP Location: Right Arm, Patient Position: Sitting, Cuff Size: Normal)   Pulse 84   Wt 207 lb 11.2 oz (94.2 kg)   LMP 03/07/2012 (Approximate)   BMI 32.53 kg/m   Physical Exam  Constitutional: She is oriented to person, place, and time. No distress.  Alert, nicely groomed Able to rise from a chair and walk down the hall without difficulty Lips are pink.  Odor of cigarette smoke.   HENT:  Head: Atraumatic.  Eyes:  Conjugate gaze, no eye redness/drainage  Neck: Neck supple.  Cardiovascular: Normal rate and regular rhythm.  Pulmonary/Chest: No respiratory distress.  Lungs clear, symmetric breath sounds Coarse scattered rhonchi, symmetric throughout  Abdominal: She exhibits no distension.  Musculoskeletal: Normal range of motion. She exhibits no edema.  No leg swelling  Neurological: She is alert and oriented to person, place, and time.  Skin: Skin is warm and dry.  No cyanosis  Nursing note and vitals reviewed.       Assessment & Plan:      COPD exacerbation Tobacco user     Waiting on [facility] pulmonology referral. Interested in referral to pain management but needs referral from PCP; not clear  that [facility] is able to make referral to pain clinic. Continue efforts to reduce/quit smoking followup in 2 months   Wynona Luna, MD   [facility]

## 2017-08-15 ENCOUNTER — Encounter: Payer: PRIVATE HEALTH INSURANCE | Admitting: Obstetrics and Gynecology

## 2017-08-19 ENCOUNTER — Telehealth: Payer: Self-pay | Admitting: Pharmacist

## 2017-08-19 NOTE — Telephone Encounter (Signed)
08/19/17 Received notice from [facility] stating time for refill on ProAir 0.09mg , checked and faxed back to [facility] to process for refill.Delos Haring

## 2017-08-21 ENCOUNTER — Encounter: Payer: PRIVATE HEALTH INSURANCE | Admitting: Obstetrics and Gynecology

## 2017-08-29 ENCOUNTER — Encounter: Payer: Self-pay | Admitting: Obstetrics and Gynecology

## 2017-08-29 ENCOUNTER — Ambulatory Visit (INDEPENDENT_AMBULATORY_CARE_PROVIDER_SITE_OTHER): Payer: PRIVATE HEALTH INSURANCE | Admitting: Obstetrics and Gynecology

## 2017-08-29 VITALS — BP 120/73 | HR 88 | Ht 67.0 in | Wt 203.2 lb

## 2017-08-29 DIAGNOSIS — N941 Unspecified dyspareunia: Secondary | ICD-10-CM | POA: Insufficient documentation

## 2017-08-29 DIAGNOSIS — N901 Moderate vulvar dysplasia: Secondary | ICD-10-CM

## 2017-08-29 DIAGNOSIS — N879 Dysplasia of cervix uteri, unspecified: Secondary | ICD-10-CM

## 2017-08-29 DIAGNOSIS — N952 Postmenopausal atrophic vaginitis: Secondary | ICD-10-CM

## 2017-08-29 DIAGNOSIS — N904 Leukoplakia of vulva: Secondary | ICD-10-CM

## 2017-08-29 MED ORDER — ESTROGENS, CONJUGATED 0.625 MG/GM VA CREA: TOPICAL_CREAM | VAGINAL | 1 refills | Status: AC

## 2017-08-29 NOTE — Patient Instructions (Signed)
1.  Colposcopy of vulva is normal today 2.  Pap smear has been done 3.  Recommend estrogen cream 1/2 g intravaginal twice a week.  Prescription is sent to pharmacy 4.  Recommend consideration of vaginal dilator therapy.  Recommend going to the following website:  www.vaginismus.com-look for medical grade dilators 5.  Return in 6 months for colposcopy of vulva and follow-up on dyspareunia

## 2017-09-03 ENCOUNTER — Telehealth: Payer: Self-pay | Admitting: Pharmacist

## 2017-09-03 NOTE — Telephone Encounter (Signed)
09/03/2017 2:12:10 PM - Premarin Vaginal Cream  09/03/17 Received a pharmacy printout for new med- Premarin Vaginal Cream 0.625mg /30GM-Place 1-2 grams intravaginally two times a week #3. Engineer, manufacturing form-mailing patient her portion to sign & return with poi/support, [id number] & Recertification packet. Taking provider portion to Dr. Hassell Done DeFrancesco @ [facility] @ [place] [address].Brittney Chavez

## 2017-09-04 NOTE — Progress Notes (Signed)
Chief complaint: 1.  Colposcopy 2.  Dyspareunia 3.  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  History of vulvar dysplasia, status post wide local excision with positive margin  68-month follow-up evaluation.  Patient reports no significant vulvar itching or burning.  She reports no atypical vaginal discharge or bleeding.  Significant new symptoms include dyspareunia with deep thrusting intercourse.  Patient has developed a new relationship partner and has noted significant issues with discomfort with deep thrusting intercourse.  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 Patient is a chronic active 1 pack a day smoker. 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  12/112017 LEEP cone biopsy and wide local excision of vulva DIAGNOSIS:  A. ENDOCERVIX, SAMPLED POST LEEP; CURRETTINGS:  - SCANT DETACHED STRIPS OF DYSPLASTIC SQUAMOUS EPITHELIUM THAT CANNOT  BE RELIABLY GRADED.   B. ECTOCERVIX; LEEP:  - ECTOCERVICAL SQUAMOUS MUCOSA WITH LOW GRADE SQUAMOUS INTRA EPITHELIAL  LESION (LSIL / CIN 1) INVOLVING ALL QUADRANTS.  - DYSPLASIA EXTENDS TO BOTH INKED MARGINS IN THE 6:00 SPECIMEN (SHORT  STITCH).   C. ENDOCERVIX; EXCISION:  - LOW GRADE SQUAMOUS INTRA EPITHELIAL LESION (LSIL / CIN1).  - DYSPLASIA FOCALLY INVOLVES THE ECTOCERVICAL SQUAMOUS MARGIN.  - SQUAMOUS METAPLASIA EXTENDS TO THE ENDOCERVICAL MARGIN.  - TRANSFORMATION ZONE PRESENT, NOT ENTIRELY EXCISED.   D. LABIA, RIGHT; WIDE EXCISION:  - FOCAL HIGH GRADE SQUAMOUS INTRA EPITHELIAL LESION (HSIL / VIN2) IN A  BACKGROUND OF EXTENSIVE LOW GRADE SQUAMOUS INTRA EPITHELIAL LESION (LSIL  / VIN1.).  - LOW GRADE  DYSPLASIA INVOLVES ALL MARGINS OF THE SPECIMEN.   02/12/2017 ECC-benign 08/29/2017 Pap/HPV-   BP 120/73   Pulse 88   Ht 5\' 7"  (1.702 m)   Wt 203 lb 3.2 oz (92.2 kg)   LMP 03/07/2012 (Approximate)   BMI 31.83 kg/m  Pleasant well-appearing female in no acute distress.  Alert and oriented. Abdomen: Soft, nontender PELVIC:             External Genitalia: Right labia minora wide local excision biopsy site is healed; there is a 5 mm defect in the mucosa (through and through); no leukoplakia; no epithelial skin breakdown             BUS: Normal             Vagina: Mild to moderate atrophic changes; introitus, slightly narrowed; pelvic exam is fairly well-tolerated but slightly uncomfortable             Cervix: Normal; no lesions seen; no cervical motion tenderness; Pap smear is taken             Uterus: Normal size, shape,consistency, mobile, midplane, nontender             Adnexa: Nonpalpable and nontender             RV: Normal external exam             Bladder: Nontender  PROCEDURE: Vulvar colposcopy Indications: 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  Patient is placed in the dorsolithotomy position.  The vulva is soaked with 4 x 4 pads moistened with acetic acid solution.  Colposcopy is performed and reveals no significant abnormalities.  Procedure is well-tolerated.  No blood loss.  ASSESSMENT: 1.  History of VIN 2, status post wide local excision 2.  Normal vulvar colposcopy today 3.  History of cervical dysplasia, status post LEEP cone biopsy 4.  Dyspareunia, likely secondary to vaginal atrophy and narrowed introitus  PLAN: 1.  Vulvar colposcopy as noted above 2.  Pap smear 3.  Begin Premarin cream intravaginal 1/2 g twice a week 4.  Information is given regarding vaginal dilator therapy 5.   Vaginal lubricants recommended 6.  Return in 6 months for follow-up colposcopy and assessment of dyspareunia  A total of 25 minutes were spent face-to-face with the patient during this encounter and over half of that time dealt with counseling and coordination of care.  Brayton Mars, MD  Note: This dictation was prepared with Dragon dictation along with smaller phrase technology. Any transcriptional errors that result from this process are unintentional.

## 2017-09-05 LAB — IGP, COBASHPV16/18
HPV 16: NEGATIVE
HPV 18: NEGATIVE
HPV other hr types: NEGATIVE
PAP Smear Comment: 0

## 2017-09-12 ENCOUNTER — Telehealth: Payer: Self-pay | Admitting: Pharmacist

## 2017-09-12 ENCOUNTER — Telehealth: Payer: Self-pay | Admitting: Pharmacy Technician

## 2017-09-12 NOTE — Telephone Encounter (Signed)
09/12/2017 8:50:20 AM - Premarin Vaginal cream  09/12/17 Called Dr. Enzo Bi office left 2 messages on main mailbox, checking to see if forms were ready for pick up, dropped off 09/05/17. Went by provider office on 09/11/17 for pick up, they are unable to locate the forms, request to bring another form to them. I have reprinted the provider portion and will take by their office.Delos Haring

## 2017-09-12 NOTE — Telephone Encounter (Signed)
Received updated proof of income.  Patient eligible to receive medication assistance at [facility] through 2019, as long as eligibility requirements continue to be met.  [facility]

## 2017-09-24 ENCOUNTER — Telehealth: Payer: Self-pay | Admitting: Pharmacist

## 2017-09-24 NOTE — Telephone Encounter (Signed)
09/24/2017 10:03:31 AM - Premarin Vaginal cream  09/24/17 Faxed [employer] application for Premarin Vaginal Cream 0.625gm/gm apply 1/2 grams vaginally 2 times a week.Brittney Chavez

## 2017-10-30 ENCOUNTER — Other Ambulatory Visit: Payer: Self-pay | Admitting: Adult Health Nurse Practitioner

## 2017-11-04 ENCOUNTER — Telehealth: Payer: Self-pay | Admitting: Pharmacist

## 2017-11-04 NOTE — Telephone Encounter (Signed)
11/04/2017 10:43:05 AM - ProAir refill  11/04/17 Received notice from [employer] for refill on ProAir, faxed back requesting refill.Brittney Chavez

## 2017-12-10 ENCOUNTER — Other Ambulatory Visit: Payer: Self-pay | Admitting: Internal Medicine

## 2017-12-12 ENCOUNTER — Ambulatory Visit: Payer: PRIVATE HEALTH INSURANCE | Admitting: Family Medicine

## 2017-12-12 VITALS — BP 117/77 | HR 91 | Temp 97.6°F | Ht 62.0 in | Wt 202.5 lb

## 2017-12-12 DIAGNOSIS — Z0000 Encounter for general adult medical examination without abnormal findings: Secondary | ICD-10-CM

## 2017-12-12 DIAGNOSIS — J439 Emphysema, unspecified: Secondary | ICD-10-CM

## 2017-12-12 DIAGNOSIS — E039 Hypothyroidism, unspecified: Secondary | ICD-10-CM

## 2017-12-12 DIAGNOSIS — I10 Essential (primary) hypertension: Secondary | ICD-10-CM

## 2017-12-12 DIAGNOSIS — I499 Cardiac arrhythmia, unspecified: Secondary | ICD-10-CM

## 2017-12-12 DIAGNOSIS — M5432 Sciatica, left side: Secondary | ICD-10-CM

## 2017-12-12 DIAGNOSIS — Z720 Tobacco use: Secondary | ICD-10-CM

## 2017-12-12 DIAGNOSIS — R0602 Shortness of breath: Secondary | ICD-10-CM

## 2017-12-12 DIAGNOSIS — Z09 Encounter for follow-up examination after completed treatment for conditions other than malignant neoplasm: Secondary | ICD-10-CM

## 2017-12-12 MED ORDER — GABAPENTIN 300 MG PO CAPS: 300.0000 mg | ORAL_CAPSULE | Freq: Three times a day (TID) | ORAL | 0 refills | Status: DC

## 2017-12-12 NOTE — Progress Notes (Signed)
Subjective:    Patient ID: Brittney Chavez, female    DOB: 11-14-1960, 57 y.o.   MRN: [id number]   PCP: Kathe Becton, NP  Chief Complaint  Patient presents with  . Back Pain  . Medication Refill     HPI  Brittney Chavez has a history of Thyroid Disease, Scoliosis, PTSD, Neuropathy, Hypothyroidism, Hepatitis C, GERD, DDD, Barrett's Esophagus, Arthritis, and Anxiety.   Current Status: She is doing well with no complaints. She denies fevers, chills, fatigue, recent infections, weight loss, and night sweats.   She has not had any headaches, visual changes, dizziness, and falls.   She states that at her last office visit 06/2017, she was referred to pulmonology. She has no c/o chest pain, heart palpitations, cough and shortness of breath reported.   No reports of GI problems.   She has no reports of blood in stools, dysuria and hematuria.   No depression or anxiety.   She has no pain today.   She recently applied for disability.   Past Medical History:  Diagnosis Date  . Abnormal Pap smear of cervix   . Anxiety   . Arthritis   . Barrett esophagus   . 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 Colon polyp    unc  . DDD (degenerative disc disease), lumbar   . GERD (gastroesophageal reflux disease)   . Hepatitis C   . Hepatitis C   . Hypothyroidism   . Neuropathy   . PTSD (post-traumatic stress disorder)   . Scoliosis   . Thyroid disease   . Torn ACL (anterior cruciate ligament)    Left Knee    Family History  Problem Relation Age of Onset  . Cancer Mother   . Diabetes Mother   . COPD Mother   . Breast cancer Mother 68  . COPD Father   . Stroke Father   . Polycystic ovary syndrome Daughter   . Birth defects Maternal Grandmother   . Colon cancer Maternal Grandmother   . Breast cancer Maternal Grandmother   . Birth defects Paternal Grandmother   . Breast cancer Paternal Grandmother   . Ovarian cancer Neg Hx   . Heart disease Neg Hx     Social  History   Socioeconomic History  . Marital status: Single    Spouse name: Not on file  . Number of children: Not on file  . Years of education: Not on file  . Highest education level: Not on file  Occupational History  . Not on file  Social Needs  . Financial resource strain: Patient refused  . Food insecurity:    Worry: Patient refused    Inability: Patient refused  . Transportation needs:    Medical: Patient refused    Non-medical: Patient refused  Tobacco Use  . Smoking status: Current Every Day Smoker    Packs/day: 0.25    Years: 30.00    Pack years: 7.50    Types: Cigarettes  . Smokeless tobacco: Never Used  Substance and Sexual Activity  . Alcohol use: No  . Drug use: Yes    Types: Marijuana    Comment: 2 x a week  . Sexual activity: Yes    Birth control/protection: Post-menopausal  Lifestyle  . Physical activity:    Days per week: 0 days    Minutes per session: 0 min  . Stress: Very much  Relationships  . Social connections:    Talks on phone: Not on file  Gets together: Not on file    Attends religious service: Not on file    Active member of club or organization: Not on file    Attends meetings of clubs or organizations: Not on file    Relationship status: Not on file  . Intimate partner violence:    Fear of current or ex partner: No    Emotionally abused: No    Physically abused: No    Forced sexual activity: No  Other Topics Concern  . Not on file  Social History Narrative  . Not on file    Past Surgical History:  Procedure Laterality Date  . CERVICAL BIOPSY  W/ LOOP ELECTRODE EXCISION    . CERVICAL CONE BIOPSY    . CERVIX LESION DESTRUCTION    . DILATION AND CURETTAGE OF UTERUS     x 2  . LEEP N/A 06/04/2016   Procedure: LOOP ELECTROSURGICAL EXCISION PROCEDURE (LEEP);  Surgeon: Brayton Mars, MD;  Location: [facility] ORS;  Service: Gynecology;  Laterality: N/A;  . NECK SURGERY     Cervical Fusion  . THYROIDECTOMY, PARTIAL Bilateral    . TONSILLECTOMY     as a child  . TUBAL LIGATION     Immunization History  Administered Date(s) Administered  . Influenza Inj Mdck Quad Pf 04/11/2016  . Influenza,inj,Quad PF,6+ Mos 07/14/2017  . Pneumococcal Polysaccharide-23 07/14/2017    Current Meds  Medication Sig  . acetaminophen (TYLENOL) 500 MG tablet Take 500 mg by mouth every 6 (six) hours as needed for mild pain.   . cetirizine (ZYRTEC) 10 MG tablet Take 1 tablet (10 mg total) by mouth daily.  . hydrochlorothiazide (HYDRODIURIL) 25 MG tablet Take 1 tablet (25 mg total) by mouth daily.  Marland Kitchen levothyroxine (SYNTHROID, LEVOTHROID) 100 MCG tablet TAKE ONE TABLET BY MOUTH EVERY DAY BEFORE BREAKFAST  . omeprazole (PRILOSEC) 20 MG capsule TAKE ONE CAPSULE BY MOUTH EVERY DAY  . PROAIR HFA 108 (90 Base) MCG/ACT inhaler INHALE 2 PUFFS INTO THE LUNGS EVERY 6 HOURS AS NEEDED FOR WHEEZING ORSHORTNESS OF BREATH  . tiotropium (SPIRIVA) 18 MCG inhalation capsule Place 1 capsule (18 mcg total) into inhaler and inhale daily.   Allergies  Allergen Reactions  . Bee Pollen Shortness Of Breath and Swelling  . Gabapentin Other (See Comments)    Causes confusion  . Nsaids Nausea Only    Other reaction(s): OTHER Blisters in mouth  . Pollen Extract     BP 117/77   Pulse 91   Temp 97.6 F (36.4 C)   Ht 5\' 2"  (1.575 m)   Wt 202 lb 8 oz (91.9 kg)   LMP 03/07/2012 (Approximate)   BMI 37.04 kg/m   Review of Systems  Constitutional: Negative.   HENT: Negative.   Eyes: Negative.   Respiratory: Negative.   Cardiovascular: Negative.   Gastrointestinal: Negative.   Endocrine: Negative.   Genitourinary: Negative.   Musculoskeletal: Negative.   Skin: Negative.   Allergic/Immunologic: Negative.   Neurological: Negative.   Hematological: Negative.   Psychiatric/Behavioral: Negative.    Objective:   Physical Exam  Constitutional: She is oriented to person, place, and time.  HENT:  Head: Normocephalic and atraumatic.  Right Ear:  External ear normal.  Left Ear: External ear normal.  Nose: Nose normal.  Mouth/Throat: Oropharynx is clear and moist.  Eyes: Pupils are equal, round, and reactive to light. Conjunctivae and EOM are normal.  Xanthelasma build up on outer eyelids.   Neck: Normal range of motion. Neck supple.  Cardiovascular: Normal rate, regular rhythm, normal heart sounds and intact distal pulses.  Pulmonary/Chest: Effort normal and breath sounds normal.  Abdominal: Soft. Bowel sounds are normal.  Musculoskeletal: Normal range of motion.  Neurological: She is alert and oriented to person, place, and time.  Skin: Skin is warm and dry. Capillary refill takes less than 2 seconds.  Psychiatric: She has a normal mood and affect. Her behavior is normal. Judgment and thought content normal.  Nursing note and vitals reviewed.  Assessment & Plan:   1. Pulmonary emphysema, unspecified emphysema type ([place]) Stable today. No signs and symptoms of respiratory distress. We will refer her to Pulmonology today.  - CBC w/Diff - Ambulatory referral to Pulmonology  2. Hypothyroidism, unspecified type TSH level elevated at 10.450 on 07/23/2017. Patient was lost to follow up. We will re-evaluate TSH today. She will follow up in 2 weeks.  - TSH  3. Hypertension, unspecified type Blood pressure is 117/77 today. She will continue HCTZ as prescribed.  - TSH - CBC w/Diff  4. Irregular heartbeat Stable. Monitor.   5. Tobacco user She continues to smoke less than 1 pack/day. She has Nicotine Patches and Nicotine Gum at home.  6. Shortness of breath Stable today. She will continue Albuterol Inhaler and Spiriva as prescribed. She has a portable oxygen tank at home. Continue to use Oxygen at 2 Liters as needed.   7. Health care maintenance - TSH - CBC w/Diff - Comprehensive metabolic panel - Lipid Profile  8. Sciatica of left side Chronic left back, neck, and hip pain. We will initiate Neurontin today. She will  continue Motrin and Acetaminophen as needed.  - gabapentin (NEURONTIN) 300 MG capsule; Take 1 capsule (300 mg total) by mouth 3 (three) times daily.  Dispense: 90 capsule; Refill: 0  9. Follow up She will follow up in 2 weeks for re-evaluation of TSH levels and effectiveness of Neurontin.   Meds ordered this encounter  Medications  . gabapentin (NEURONTIN) 300 MG capsule    Sig: Take 1 capsule (300 mg total) by mouth 3 (three) times daily.    Dispense:  90 capsule    Refill:  0    Kathe Becton,  MSN, FNP-BC [facility] [place] [address] [place], [place] [address] [phone]

## 2017-12-13 ENCOUNTER — Other Ambulatory Visit: Payer: Self-pay | Admitting: Internal Medicine

## 2017-12-13 LAB — COMPREHENSIVE METABOLIC PANEL
ALT: 13 IU/L (ref 0–32)
AST: 14 IU/L (ref 0–40)
Albumin/Globulin Ratio: 1.2 (ref 1.2–2.2)
Albumin: 4.2 g/dL (ref 3.5–5.5)
Alkaline Phosphatase: 65 IU/L (ref 39–117)
BUN/Creatinine Ratio: 18 (ref 9–23)
BUN: 22 mg/dL (ref 6–24)
Bilirubin Total: <0.2 mg/dL (ref 0.0–1.2)
CO2: 22 mmol/L (ref 20–29)
Calcium: 9.9 mg/dL (ref 8.7–10.2)
Chloride: 100 mmol/L (ref 96–106)
Creatinine, Ser: 1.25 mg/dL — ABNORMAL HIGH (ref 0.57–1.00)
GFR calc Af Amer: 56 mL/min/{1.73_m2} — ABNORMAL LOW (ref >59)
GFR calc non Af Amer: 48 mL/min/{1.73_m2} — ABNORMAL LOW (ref >59)
Globulin, Total: 3.6 g/dL (ref 1.5–4.5)
Glucose: 103 mg/dL — ABNORMAL HIGH (ref 65–99)
Potassium: 3.9 mmol/L (ref 3.5–5.2)
Sodium: 141 mmol/L (ref 134–144)
Total Protein: 7.8 g/dL (ref 6.0–8.5)

## 2017-12-13 LAB — LIPID PANEL
Chol/HDL Ratio: 7.1 ratio — ABNORMAL HIGH (ref 0.0–4.4)
Cholesterol, Total: 240 mg/dL — ABNORMAL HIGH (ref 100–199)
HDL: 34 mg/dL — ABNORMAL LOW (ref >39)
Triglycerides: 503 mg/dL — ABNORMAL HIGH (ref 0–149)

## 2017-12-13 LAB — CBC WITH DIFFERENTIAL/PLATELET
Basophils Absolute: 0.1 10*3/uL (ref 0.0–0.2)
Basos: 1 %
EOS (ABSOLUTE): 0.3 10*3/uL (ref 0.0–0.4)
Eos: 2 %
Hematocrit: 44 % (ref 34.0–46.6)
Hemoglobin: 15.3 g/dL (ref 11.1–15.9)
Immature Grans (Abs): 0 10*3/uL (ref 0.0–0.1)
Immature Granulocytes: 0 %
Lymphocytes Absolute: 3.9 10*3/uL — ABNORMAL HIGH (ref 0.7–3.1)
Lymphs: 33 %
MCH: 31.2 pg (ref 26.6–33.0)
MCHC: 34.8 g/dL (ref 31.5–35.7)
MCV: 90 fL (ref 79–97)
Monocytes Absolute: 1 10*3/uL — ABNORMAL HIGH (ref 0.1–0.9)
Monocytes: 8 %
Neutrophils Absolute: 6.6 10*3/uL (ref 1.4–7.0)
Neutrophils: 56 %
Platelets: 322 10*3/uL (ref 150–450)
RBC: 4.9 x10E6/uL (ref 3.77–5.28)
RDW: 13.9 % (ref 12.3–15.4)
WBC: 11.8 10*3/uL — ABNORMAL HIGH (ref 3.4–10.8)

## 2017-12-13 LAB — TSH: TSH: 3.21 u[IU]/mL (ref 0.450–4.500)

## 2017-12-15 ENCOUNTER — Encounter: Payer: Self-pay | Admitting: Family Medicine

## 2017-12-31 ENCOUNTER — Ambulatory Visit: Payer: PRIVATE HEALTH INSURANCE | Admitting: Family Medicine

## 2017-12-31 ENCOUNTER — Other Ambulatory Visit: Payer: Self-pay

## 2017-12-31 ENCOUNTER — Emergency Department
Admission: EM | Admit: 2017-12-31 | Discharge: 2017-12-31 | Disposition: A | Discharge status: Home / Self Care | Payer: Self-pay | Attending: Emergency Medicine | Admitting: Emergency Medicine

## 2017-12-31 VITALS — BP 102/68 | HR 79 | Temp 96.7°F | Wt 203.8 lb

## 2017-12-31 DIAGNOSIS — E039 Hypothyroidism, unspecified: Secondary | ICD-10-CM | POA: Insufficient documentation

## 2017-12-31 DIAGNOSIS — J449 Chronic obstructive pulmonary disease, unspecified: Secondary | ICD-10-CM | POA: Insufficient documentation

## 2017-12-31 DIAGNOSIS — Z8541 Personal history of malignant neoplasm of cervix uteri: Secondary | ICD-10-CM | POA: Insufficient documentation

## 2017-12-31 DIAGNOSIS — M5432 Sciatica, left side: Secondary | ICD-10-CM

## 2017-12-31 DIAGNOSIS — L02419 Cutaneous abscess of limb, unspecified: Secondary | ICD-10-CM

## 2017-12-31 DIAGNOSIS — G609 Hereditary and idiopathic neuropathy, unspecified: Secondary | ICD-10-CM

## 2017-12-31 DIAGNOSIS — Z79899 Other long term (current) drug therapy: Secondary | ICD-10-CM | POA: Insufficient documentation

## 2017-12-31 DIAGNOSIS — Z720 Tobacco use: Secondary | ICD-10-CM

## 2017-12-31 DIAGNOSIS — E781 Pure hyperglyceridemia: Secondary | ICD-10-CM

## 2017-12-31 DIAGNOSIS — I10 Essential (primary) hypertension: Secondary | ICD-10-CM

## 2017-12-31 DIAGNOSIS — F17210 Nicotine dependence, cigarettes, uncomplicated: Secondary | ICD-10-CM | POA: Insufficient documentation

## 2017-12-31 DIAGNOSIS — L03119 Cellulitis of unspecified part of limb: Secondary | ICD-10-CM

## 2017-12-31 DIAGNOSIS — L03115 Cellulitis of right lower limb: Secondary | ICD-10-CM | POA: Insufficient documentation

## 2017-12-31 MED ORDER — SULFAMETHOXAZOLE-TRIMETHOPRIM 800-160 MG PO TABS
1.0000 | ORAL_TABLET | Freq: Once | ORAL | Status: AC
  Administered 2017-12-31: 1 via ORAL
  Filled 2017-12-31: qty 1

## 2017-12-31 MED ORDER — HYDROCODONE-ACETAMINOPHEN 5-325 MG PO TABS: 1.0000 | ORAL_TABLET | Freq: Four times a day (QID) | ORAL | 0 refills | Status: AC | PRN

## 2017-12-31 MED ORDER — ICOSAPENT ETHYL 1 G PO CAPS: 2.0000 | ORAL_CAPSULE | Freq: Two times a day (BID) | ORAL | 3 refills | Status: DC

## 2017-12-31 MED ORDER — SULFAMETHOXAZOLE-TRIMETHOPRIM 800-160 MG PO TABS: 1.0000 | ORAL_TABLET | Freq: Two times a day (BID) | ORAL | 0 refills | Status: DC

## 2017-12-31 MED ORDER — GABAPENTIN 300 MG PO CAPS: 300.0000 mg | ORAL_CAPSULE | Freq: Four times a day (QID) | ORAL | 0 refills | Status: DC

## 2017-12-31 NOTE — ED Provider Notes (Signed)
[facility] Emergency Department Provider Note  ____________________________________________  Time seen: Approximately 8:22 PM  I have reviewed the triage vital signs and the nursing notes.   HISTORY  Chief Complaint Leg Pain and Insect Bite   HPI Brittney Chavez is a 57 y.o. female who presents to the emergency department for treatment and evaluation of abscess to the inner aspect of the right calf.  She was diagnosed today at [facility] and was given antibiotics but she questions the need for incision and drainage.  She has had no nausea, vomiting, diarrhea or fever.  Significant past medical history includes hepatitis C.   Past Medical History:  Diagnosis Date  . Abnormal Pap smear of cervix   . Anxiety   . Arthritis   . Barrett esophagus   . 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 Colon polyp    unc  . DDD (degenerative disc disease), lumbar   . GERD (gastroesophageal reflux disease)   . Hepatitis C   . Hepatitis C   . Hypothyroidism   . Neuropathy   . PTSD (post-traumatic stress disorder)   . Scoliosis   . Thyroid disease   . Torn ACL (anterior cruciate ligament)    Left Knee    Patient Active Problem List   Diagnosis Date Noted  . Vaginal atrophy 08/29/2017  . Dyspareunia in female 08/29/2017  . Hypothyroidism 07/23/2017  . COPD with acute exacerbation ([place]) 07/13/2017  . COPD exacerbation ([place]) 07/13/2017  . 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 Dysplasia of cervix 05/02/2016  . 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 Tobacco user 04/17/2016  . Vulvar lesion 04/17/2016  . Scoliosis 03/21/2016  . History of underactive thyroid 03/21/2016  . Dysphagia 03/21/2016    Past Surgical History:  Procedure Laterality Date  . CERVICAL BIOPSY  W/ LOOP ELECTRODE EXCISION    . CERVICAL CONE BIOPSY    . CERVIX LESION DESTRUCTION    . DILATION AND CURETTAGE OF UTERUS     x 2  . LEEP N/A 06/04/2016   Procedure: LOOP ELECTROSURGICAL EXCISION PROCEDURE (LEEP);  Surgeon: Brayton Mars, MD;  Location: [facility] ORS;  Service: Gynecology;  Laterality: N/A;  . NECK SURGERY     Cervical Fusion  . THYROIDECTOMY, PARTIAL Bilateral   . TONSILLECTOMY     as a child  . TUBAL LIGATION      Prior to Admission medications   Medication Sig Start Date End Date Taking? Authorizing Provider  acetaminophen (TYLENOL) 500 MG tablet Take 500 mg by mouth every 6 (six) hours as needed for mild pain.     [provider]  cetirizine (ZYRTEC) 10 MG tablet Take 1 tablet (10 mg total) by mouth daily. 03/26/17   Zara Council A, PA-C  conjugated estrogens (PREMARIN) vaginal cream 1-2 gram intravaginal 2 times a week 08/29/17   Defrancesco, Alanda Slim, MD  gabapentin (NEURONTIN) 300 MG capsule Take 1 capsule (300 mg total) by mouth 4 (four) times daily. 12/31/17   Arnetha Courser, MD  HYDROcodone-acetaminophen (NORCO/VICODIN) 5-325 MG tablet Take 1 tablet by mouth every 6 (six) hours as needed for up to 3 days for severe pain. 12/31/17 01/03/18  Karielle Davidow, Dessa Phi, FNP  Icosapent Ethyl (VASCEPA) 1 g CAPS Take 2 capsules (2 g total) by mouth 2 (two) times daily. 12/31/17   Lada, Satira Anis, MD  levothyroxine (SYNTHROID, LEVOTHROID) 100 MCG tablet TAKE ONE TABLET BY MOUTH EVERY DAY BEFORE BREAKFAST  12/16/17   Tawni Millers, MD  omeprazole (PRILOSEC) 20 MG capsule TAKE ONE CAPSULE BY MOUTH EVERY DAY 10/30/17   Doles-Johnson, Teah, NP  PROAIR HFA 108 (90 Base) MCG/ACT inhaler INHALE 2 PUFFS INTO THE LUNGS EVERY 6 HOURS AS NEEDED FOR WHEEZING ORSHORTNESS OF BREATH 12/10/17   McGowan, Larene Beach A, PA-C  sulfamethoxazole-trimethoprim (BACTRIM DS,SEPTRA DS) 800-160 MG tablet Take 1 tablet by mouth 2 (two) times daily. 12/31/17   Arnetha Courser, MD  tiotropium (SPIRIVA) 18 MCG inhalation capsule Place 1 capsule (18 mcg total) into inhaler and inhale daily. 07/15/17   Bettey Costa, MD  valACYclovir (VALTREX) 1000 MG tablet Take 1 tablet  (1,000 mg total) by mouth daily as needed. FLARE UPS 03/26/17   Zara Council A, PA-C    Allergies Bee pollen; Gabapentin; Nsaids; and Pollen extract  Family History  Problem Relation Age of Onset  . Cancer Mother   . Diabetes Mother   . COPD Mother   . Breast cancer Mother 55  . COPD Father   . Stroke Father   . Polycystic ovary syndrome Daughter   . Birth defects Maternal Grandmother   . Colon cancer Maternal Grandmother   . Breast cancer Maternal Grandmother   . Birth defects Paternal Grandmother   . Breast cancer Paternal Grandmother   . Ovarian cancer Neg Hx   . Heart disease Neg Hx     Social History Social History   Tobacco Use  . Smoking status: Current Every Day Smoker    Packs/day: 0.25    Years: 30.00    Pack years: 7.50    Types: Cigarettes  . Smokeless tobacco: Never Used  Substance Use Topics  . Alcohol use: No  . Drug use: Yes    Types: Marijuana    Comment: 2 x a week    Review of Systems  Constitutional: Negative for fever. Respiratory: Negative for cough or shortness of breath.  Musculoskeletal: Negative for myalgias Skin: Positive for lesion on the right lower extremity.  Neurological: Negative for numbness or paresthesias. ____________________________________________   PHYSICAL EXAM:  VITAL SIGNS: ED Triage Vitals  Enc Vitals Group     BP 12/31/17 1950 135/74     Pulse Rate 12/31/17 1950 80     Resp 12/31/17 1950 19     Temp 12/31/17 1950 98.2 F (36.8 C)     Temp Source 12/31/17 1950 Oral     SpO2 12/31/17 1950 96 %     Weight 12/31/17 1950 205 lb (93 kg)     Height 12/31/17 1950 5\' 4"  (1.626 m)     Head Circumference --      Peak Flow --      Pain Score 12/31/17 1954 9     Pain Loc --      Pain Edu? --      Excl. in [place]? --      Constitutional: Well appearing. Eyes: Conjunctivae are clear without discharge or drainage. Nose: No rhinorrhea noted. Mouth/Throat: Airway is patent.  Neck: No stridor. Unrestricted range of  motion observed. Cardiovascular: Capillary refill is <3 seconds.  Respiratory: Respirations are even and unlabored.. Musculoskeletal: Unrestricted range of motion observed. Neurologic: Awake, alert, and oriented x 4.  Skin:  2cm area of erythema on the right lower extremity with central darkened area. No fluctuance. Surrounding area is indurated.  ____________________________________________   LABS (all labs ordered are listed, but only abnormal results are displayed)  Labs Reviewed - No data to display ____________________________________________  EKG  Not indicated. ____________________________________________  RADIOLOGY  Not indicated ____________________________________________   PROCEDURES  Procedures ____________________________________________   INITIAL IMPRESSION / ASSESSMENT AND PLAN / ED COURSE  Brittney Chavez is a 57 y.o. female who presents to the emergency department for treatment and evaluation of a lesion to her right calf.  She states that she believes she was bitten by spider and now has a tender area that has not been resolving with application of peroxide and alcohol.  Patient states that she has neuropathy in her lower extremities and the fact that this is so painful told her that it has got to be infected.  There is no fluctuance in the area and I do not feel that I&D is required.  Area has some induration surrounding a central pinpoint area that would be consistent with some type of insect bite.  She was prescribed Bactrim today, but did not go by the pharmacy to pick it up and instead came to the emergency department.  She will be given her first dose of Bactrim and a prescription for a few Norco.  She is to go to the pharmacy tomorrow to pick up the prescription for Bactrim.  The area of erythema was marked with a surgical pen and she is to follow-up with her primary care provider if the erythema spreads outside of the marked line or return to the emergency  department.   Medications  sulfamethoxazole-trimethoprim (BACTRIM DS,SEPTRA DS) 800-160 MG per tablet 1 tablet (1 tablet Oral Given 12/31/17 2059)     Pertinent labs & imaging results that were available during my care of the patient were reviewed by me and considered in my medical decision making (see chart for details).  ____________________________________________   FINAL CLINICAL IMPRESSION(S) / ED DIAGNOSES  Final diagnoses:  Cellulitis of right lower extremity    ED Discharge Orders        Ordered    HYDROcodone-acetaminophen (NORCO/VICODIN) 5-325 MG tablet  Every 6 hours PRN     12/31/17 2053       Note:  This document was prepared using Dragon voice recognition software and may include unintentional dictation errors.    Victorino Dike, FNP 12/31/17 2246    Eula Listen, MD 12/31/17 [phone]

## 2017-12-31 NOTE — ED Triage Notes (Signed)
Pt arrives to ED via POV from home with c/o leg pain r/t a possible insect bite and abscess to the inner aspect of the right calf. Pt was seen earlier today for the same, r/x'd antibiotics at the [facility] clinic, but here today for possible I&D. Pt denies N/V/D, no fever.

## 2017-12-31 NOTE — ED Notes (Signed)
Skin marked around affected area with skin pen.

## 2017-12-31 NOTE — Patient Instructions (Addendum)
We'll get your vascepa from Medication management clinic Do not stop the gabapentin abruptly because of the risk of seizures Do try to avoid fried foods Start the antibiotics Please do eat yogurt or kimchi or take a probiotic daily for the next month We want to replace the healthy germs in the gut If you notice foul, watery diarrhea in the next two months, schedule an appointment RIGHT AWAY or go to an urgent care or the emergency room if a holiday or over a weekend Stop the HCTZ Return in one week for a blood pressure check Return in six weeks and have labs done then for cholesterol and thyroid  Try to follow the DASH guidelines (DASH stands for Dietary Approaches to Stop Hypertension). Try to limit the sodium in your diet to no more than 1,500mg  of sodium per day. Certainly try to not exceed 2,000 mg per day at the very most. Do not add salt when cooking or at the table.  Check the sodium amount on labels when shopping, and choose items lower in sodium when given a choice. Avoid or limit foods that already contain a lot of sodium. Eat a diet rich in fruits and vegetables and whole grains, and try to lose weight if overweight or obese   DASH Eating Plan DASH stands for "Dietary Approaches to Stop Hypertension." The DASH eating plan is a healthy eating plan that has been shown to reduce high blood pressure (hypertension). It may also reduce your risk for type 2 diabetes, heart disease, and stroke. The DASH eating plan may also help with weight loss. What are tips for following this plan? General guidelines  Avoid eating more than 2,300 mg (milligrams) of salt (sodium) a day. If you have hypertension, you may need to reduce your sodium intake to 1,500 mg a day.  Limit alcohol intake to no more than 1 drink a day for nonpregnant women and 2 drinks a day for men. One drink equals 12 oz of beer, 5 oz of wine, or 1 oz of hard liquor.  Work with your health care provider to maintain a healthy body  weight or to lose weight. Ask what an ideal weight is for you.  Get at least 30 minutes of exercise that causes your heart to beat faster (aerobic exercise) most days of the week. Activities may include walking, swimming, or biking.  Work with your health care provider or diet and nutrition specialist (dietitian) to adjust your eating plan to your individual calorie needs. Reading food labels  Check food labels for the amount of sodium per serving. Choose foods with less than 5 percent of the Daily Value of sodium. Generally, foods with less than 300 mg of sodium per serving fit into this eating plan.  To find whole grains, look for the word "whole" as the first word in the ingredient list. Shopping  Buy products labeled as "low-sodium" or "no salt added."  Buy fresh foods. Avoid canned foods and premade or frozen meals. Cooking  Avoid adding salt when cooking. Use salt-free seasonings or herbs instead of table salt or sea salt. Check with your health care provider or pharmacist before using salt substitutes.  Do not fry foods. Cook foods using healthy methods such as baking, boiling, grilling, and broiling instead.  Cook with heart-healthy oils, such as olive, canola, soybean, or sunflower oil. Meal planning   Eat a balanced diet that includes: ? 5 or more servings of fruits and vegetables each day. At each meal, try  to fill half of your plate with fruits and vegetables. ? Up to 6-8 servings of whole grains each day. ? Less than 6 oz of lean meat, poultry, or fish each day. A 3-oz serving of meat is about the same size as a deck of cards. One egg equals 1 oz. ? 2 servings of low-fat dairy each day. ? A serving of nuts, seeds, or beans 5 times each week. ? Heart-healthy fats. Healthy fats called Omega-3 fatty acids are found in foods such as flaxseeds and coldwater fish, like sardines, salmon, and mackerel.  Limit how much you eat of the following: ? Canned or prepackaged  foods. ? Food that is high in trans fat, such as fried foods. ? Food that is high in saturated fat, such as fatty meat. ? Sweets, desserts, sugary drinks, and other foods with added sugar. ? Full-fat dairy products.  Do not salt foods before eating.  Try to eat at least 2 vegetarian meals each week.  Eat more home-cooked food and less restaurant, buffet, and fast food.  When eating at a restaurant, ask that your food be prepared with less salt or no salt, if possible. What foods are recommended? The items listed may not be a complete list. Talk with your dietitian about what dietary choices are best for you. Grains Whole-grain or whole-wheat bread. Whole-grain or whole-wheat pasta. Brown rice. [place]. Bulgur. Whole-grain and low-sodium cereals. Pita bread. Low-fat, low-sodium crackers. Whole-wheat flour tortillas. Vegetables Fresh or frozen vegetables (raw, steamed, roasted, or grilled). Low-sodium or reduced-sodium tomato and vegetable juice. Low-sodium or reduced-sodium tomato sauce and tomato paste. Low-sodium or reduced-sodium canned vegetables. Fruits All fresh, dried, or frozen fruit. Canned fruit in natural juice (without added sugar). Meat and other protein foods Skinless chicken or [place]. Ground chicken or [place]. Pork with fat trimmed off. Fish and seafood. Egg whites. Dried beans, peas, or lentils. Unsalted nuts, nut butters, and seeds. Unsalted canned beans. Lean cuts of beef with fat trimmed off. Low-sodium, lean deli meat. Dairy Low-fat (1%) or fat-free (skim) milk. Fat-free, low-fat, or reduced-fat cheeses. Nonfat, low-sodium ricotta or cottage cheese. Low-fat or nonfat yogurt. Low-fat, low-sodium cheese. Fats and oils Soft margarine without trans fats. Vegetable oil. Low-fat, reduced-fat, or light mayonnaise and salad dressings (reduced-sodium). Canola, safflower, olive, soybean, and sunflower oils. Avocado. Seasoning and other foods Herbs. Spices. Seasoning  mixes without salt. Unsalted popcorn and pretzels. Fat-free sweets. What foods are not recommended? The items listed may not be a complete list. Talk with your dietitian about what dietary choices are best for you. Grains Baked goods made with fat, such as croissants, muffins, or some breads. Dry pasta or rice meal packs. Vegetables Creamed or fried vegetables. Vegetables in a cheese sauce. Regular canned vegetables (not low-sodium or reduced-sodium). Regular canned tomato sauce and paste (not low-sodium or reduced-sodium). Regular tomato and vegetable juice (not low-sodium or reduced-sodium). Angie Fava. Olives. Fruits Canned fruit in a light or heavy syrup. Fried fruit. Fruit in cream or butter sauce. Meat and other protein foods Fatty cuts of meat. Ribs. Fried meat. Berniece Salines. Sausage. Bologna and other processed lunch meats. Salami. Fatback. Hotdogs. Bratwurst. Salted nuts and seeds. Canned beans with added salt. Canned or smoked fish. Whole eggs or egg yolks. Chicken or [place] with skin. Dairy Whole or 2% milk, cream, and half-and-half. Whole or full-fat cream cheese. Whole-fat or sweetened yogurt. Full-fat cheese. Nondairy creamers. Whipped toppings. Processed cheese and cheese spreads. Fats and oils Butter. Stick margarine. Lard. Shortening. Ghee. Berniece Salines  fat. Tropical oils, such as coconut, palm kernel, or palm oil. Seasoning and other foods Salted popcorn and pretzels. Onion salt, garlic salt, seasoned salt, table salt, and sea salt. Worcestershire sauce. Tartar sauce. Barbecue sauce. Teriyaki sauce. Soy sauce, including reduced-sodium. Steak sauce. Canned and packaged gravies. Fish sauce. Oyster sauce. Cocktail sauce. Horseradish that you find on the shelf. Ketchup. Mustard. Meat flavorings and tenderizers. Bouillon cubes. Hot sauce and Tabasco sauce. Premade or packaged marinades. Premade or packaged taco seasonings. Relishes. Regular salad dressings. Where to find more information:  National  Heart, Lung, and [place]: https://wilson-eaton.com/  American Heart Association: www.heart.org Summary  The DASH eating plan is a healthy eating plan that has been shown to reduce high blood pressure (hypertension). It may also reduce your risk for type 2 diabetes, heart disease, and stroke.  With the DASH eating plan, you should limit salt (sodium) intake to 2,300 mg a day. If you have hypertension, you may need to reduce your sodium intake to 1,500 mg a day.  When on the DASH eating plan, aim to eat more fresh fruits and vegetables, whole grains, lean proteins, low-fat dairy, and heart-healthy fats.  Work with your health care provider or diet and nutrition specialist (dietitian) to adjust your eating plan to your individual calorie needs. This information is not intended to replace advice given to you by your health care provider. Make sure you discuss any questions you have with your health care provider. Document Released: 05/31/2011 Document Revised: 06/04/2016 Document Reviewed: 06/04/2016 Elsevier Interactive Patient Education  2018 [place] Risks of Smoking Smoking cigarettes is very bad for your health. Tobacco smoke has over 200 known poisons in it. It contains the poisonous gases nitrogen oxide and carbon monoxide. There are over 60 chemicals in tobacco smoke that cause cancer. Smoking is difficult to quit because a chemical in tobacco, called nicotine, causes addiction or dependence. When you smoke and inhale, nicotine is absorbed rapidly into the bloodstream through your lungs. Both inhaled and non-inhaled nicotine may be addictive. What are the risks of cigarette smoke? Cigarette smokers have an increased risk of many serious medical problems, including:  Lung cancer.  Lung disease, such as pneumonia, bronchitis, and emphysema.  Chest pain (angina) and heart attack because the heart is not getting enough oxygen.  Heart disease and peripheral blood vessel  disease.  High blood pressure (hypertension).  Stroke.  Oral cancer, including cancer of the lip, mouth, or voice box.  Bladder cancer.  Pancreatic cancer.  Cervical cancer.  Pregnancy complications, including premature birth.  Stillbirths and smaller newborn babies, birth defects, and genetic damage to sperm.  Early menopause.  Lower estrogen level for women.  Infertility.  Facial wrinkles.  Blindness.  Increased risk of broken bones (fractures).  Senile dementia.  Stomach ulcers and internal bleeding.  Delayed wound healing and increased risk of complications during surgery.  Even smoking lightly shortens your life expectancy by several years.  Because of secondhand smoke exposure, children of smokers have an increased risk of the following:  Sudden infant death syndrome (SIDS).  Respiratory infections.  Lung cancer.  Heart disease.  Ear infections.  What are the benefits of quitting? There are many health benefits of quitting smoking. Here are some of them:  Within days of quitting smoking, your risk of having a heart attack decreases, your blood flow improves, and your lung capacity improves. Blood pressure, pulse rate, and breathing patterns start returning to normal soon after quitting.  Within  months, your lungs may clear up completely.  Quitting for 10 years reduces your risk of developing lung cancer and heart disease to almost that of a nonsmoker.  People who quit may see an improvement in their overall quality of life.  How do I quit smoking? Smoking is an addiction with both physical and psychological effects, and longtime habits can be hard to change. Your health care provider can recommend:  Programs and community resources, which may include group support, education, or talk therapy.  Prescription medicines to help reduce cravings.  Nicotine replacement products, such as patches, gum, and nasal sprays. Use these products only as  directed. Do not replace cigarette smoking with electronic cigarettes, which are commonly called e-cigarettes. The safety of e-cigarettes is not known, and some may contain harmful chemicals.  A combination of two or more of these methods.  Where to find more information:  American Lung Association: www.lung.org  American Cancer Society: www.cancer.org Summary  Smoking cigarettes is very bad for your health. Cigarette smokers have an increased risk of many serious medical problems, including several cancers, heart disease, and stroke.  Smoking is an addiction with both physical and psychological effects, and longtime habits can be hard to change.  By stopping right away, you can greatly reduce the risk of medical problems for you and your family.  To help you quit smoking, your health care provider can recommend programs, community resources, prescription medicines, and nicotine replacement products such as patches, gum, and nasal sprays. This information is not intended to replace advice given to you by your health care provider. Make sure you discuss any questions you have with your health care provider. Document Released: 07/19/2004 Document Revised: 06/15/2016 Document Reviewed: 06/15/2016 Elsevier Interactive Patient Education  2017 [employer].  Steps to Quit Smoking Smoking tobacco can be bad for your health. It can also affect almost every organ in your body. Smoking puts you and people around you at risk for many serious long-lasting (chronic) diseases. Quitting smoking is hard, but it is one of the best things that you can do for your health. It is never too late to quit. What are the benefits of quitting smoking? When you quit smoking, you lower your risk for getting serious diseases and conditions. They can include:  Lung cancer or lung disease.  Heart disease.  Stroke.  Heart attack.  Not being able to have children (infertility).  Weak bones (osteoporosis) and  broken bones (fractures).  If you have coughing, wheezing, and shortness of breath, those symptoms may get better when you quit. You may also get sick less often. If you are pregnant, quitting smoking can help to lower your chances of having a baby of low birth weight. What can I do to help me quit smoking? Talk with your doctor about what can help you quit smoking. Some things you can do (strategies) include:  Quitting smoking totally, instead of slowly cutting back how much you smoke over a period of time.  Going to in-person counseling. You are more likely to quit if you go to many counseling sessions.  Using resources and support systems, such as: ? Database administrator with a Social worker. ? Phone quitlines. ? Careers information officer. ? Support groups or group counseling. ? Text messaging programs. ? Mobile phone apps or applications.  Taking medicines. Some of these medicines may have nicotine in them. If you are pregnant or breastfeeding, do not take any medicines to quit smoking unless your doctor says it is okay.  Talk with your doctor about counseling or other things that can help you.  Talk with your doctor about using more than one strategy at the same time, such as taking medicines while you are also going to in-person counseling. This can help make quitting easier. What things can I do to make it easier to quit? Quitting smoking might feel very hard at first, but there is a lot that you can do to make it easier. Take these steps:  Talk to your family and friends. Ask them to support and encourage you.  Call phone quitlines, reach out to support groups, or work with a Social worker.  Ask people who smoke to not smoke around you.  Avoid places that make you want (trigger) to smoke, such as: ? Bars. ? Parties. ? Smoke-break areas at work.  Spend time with people who do not smoke.  Lower the stress in your life. Stress can make you want to smoke. Try these things to help your  stress: ? Getting regular exercise. ? Deep-breathing exercises. ? Yoga. ? Meditating. ? Doing a body scan. To do this, close your eyes, focus on one area of your body at a time from head to toe, and notice which parts of your body are tense. Try to relax the muscles in those areas.  Download or buy apps on your mobile phone or tablet that can help you stick to your quit plan. There are many free apps, such as QuitGuide from the [employer] Office manager for Disease Control and Prevention). You can find more support from smokefree.gov and other websites.  This information is not intended to replace advice given to you by your health care provider. Make sure you discuss any questions you have with your health care provider. Document Released: 04/07/2009 Document Revised: 02/07/2016 Document Reviewed: 10/26/2014 Elsevier Interactive Patient Education  2018 [employer].
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
## 2018-01-09 DIAGNOSIS — E781 Pure hyperglyceridemia: Secondary | ICD-10-CM | POA: Insufficient documentation

## 2018-01-09 DIAGNOSIS — I10 Essential (primary) hypertension: Secondary | ICD-10-CM | POA: Insufficient documentation

## 2018-01-09 NOTE — Assessment & Plan Note (Signed)
Reviewed last tsh with her; see AVS

## 2018-01-09 NOTE — Assessment & Plan Note (Signed)
Recommended cessation.  

## 2018-01-09 NOTE — Assessment & Plan Note (Signed)
Suspect HCTZ may be exacerbating her TG; see AVS

## 2018-01-09 NOTE — Assessment & Plan Note (Signed)
Start vascepa; see AVS; return for labs

## 2018-02-04 ENCOUNTER — Other Ambulatory Visit: Payer: Self-pay

## 2018-02-04 DIAGNOSIS — M5432 Sciatica, left side: Secondary | ICD-10-CM

## 2018-02-04 MED ORDER — GABAPENTIN 300 MG PO CAPS: 300.0000 mg | ORAL_CAPSULE | Freq: Four times a day (QID) | ORAL | 0 refills | Status: DC

## 2018-02-06 ENCOUNTER — Ambulatory Visit: Payer: PRIVATE HEALTH INSURANCE | Admitting: Adult Health Nurse Practitioner

## 2018-02-06 ENCOUNTER — Ambulatory Visit: Payer: PRIVATE HEALTH INSURANCE | Admitting: Adult Health

## 2018-02-06 VITALS — BP 142/80 | HR 78 | Temp 97.7°F | Ht 64.0 in | Wt 198.5 lb

## 2018-02-06 DIAGNOSIS — G629 Polyneuropathy, unspecified: Secondary | ICD-10-CM

## 2018-02-06 DIAGNOSIS — E781 Pure hyperglyceridemia: Secondary | ICD-10-CM

## 2018-02-06 MED ORDER — ATORVASTATIN CALCIUM 40 MG PO TABS: 40.0000 mg | ORAL_TABLET | Freq: Every day | ORAL | 3 refills | Status: DC

## 2018-02-06 MED ORDER — GABAPENTIN 600 MG PO TABS: 600.0000 mg | ORAL_TABLET | Freq: Three times a day (TID) | ORAL | 2 refills | Status: DC

## 2018-02-06 NOTE — Progress Notes (Signed)
Subjective:    Patient ID: Brittney Chavez, female    DOB: [birth year], 57 y.o.   MRN: [id number]  HPI  [name] is a 57 yo F here for f/u for sciatic of L side and Hypertriglyceridemia. At last visit on 7/9 Dr. Sanda Klein increased Gabapentin to 300mg  QID and started her on Vascepa 1g. She was recently seen in the ED on 7/9 after her visit here for cellulitis of R lower extremity. She was given her first dose of Bactrim rx by [place] in the ED and some Norco.  Hypertriglyceridemia: She was not able to receive her Vascepa b/c pharmacy could not fill.  Neuropathy and Sciatica: She says she has no relief with the increase to 300mg  Gabapentin. She reports she had relief w/ 600mg  and would like to begin again. She reports Neurologist did not provide further relief except to sign up for pain clinic.  She dose endorse that she did experience some confusion on the gabapentin but would like to resume despite risk.   Patient Active Problem List   Diagnosis Date Noted  . Hypertriglyceridemia 01/09/2018  . Essential hypertension, benign 01/09/2018  . Vaginal atrophy 08/29/2017  . Dyspareunia in female 08/29/2017  . Hypothyroidism 07/23/2017  . COPD with acute exacerbation ([place]) 07/13/2017  . 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 Dysplasia of cervix 05/02/2016  . 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 Tobacco user 04/17/2016  . Vulvar lesion 04/17/2016  . Scoliosis 03/21/2016  . History of underactive thyroid 03/21/2016  . Dysphagia 03/21/2016   Allergies as of 02/06/2018      Reactions   Bee Pollen Shortness Of Breath, Swelling   Gabapentin Other (See Comments)   Causes confusion   Nsaids Nausea Only   Other reaction(s): OTHER Blisters in mouth   Pollen Extract       Medication List        Accurate as of 02/06/18  6:18 PM. Always use your most recent med list.          acetaminophen 500 MG tablet Commonly known as:  TYLENOL Take 500 mg by mouth every 6 (six) hours as  needed for mild pain.   cetirizine 10 MG tablet Commonly known as:  ZYRTEC Take 1 tablet (10 mg total) by mouth daily.   conjugated estrogens vaginal cream Commonly known as:  PREMARIN 1-2 gram intravaginal 2 times a week   gabapentin 300 MG capsule Commonly known as:  NEURONTIN Take 1 capsule (300 mg total) by mouth 4 (four) times daily.   Icosapent Ethyl 1 g Caps Take 2 capsules (2 g total) by mouth 2 (two) times daily.   levothyroxine 100 MCG tablet Commonly known as:  SYNTHROID, LEVOTHROID TAKE ONE TABLET BY MOUTH EVERY DAY BEFORE BREAKFAST   omeprazole 20 MG capsule Commonly known as:  PRILOSEC TAKE ONE CAPSULE BY MOUTH EVERY DAY   PROAIR HFA 108 (90 Base) MCG/ACT inhaler Generic drug:  albuterol INHALE 2 PUFFS INTO THE LUNGS EVERY 6 HOURS AS NEEDED FOR WHEEZING ORSHORTNESS OF BREATH   sulfamethoxazole-trimethoprim 800-160 MG tablet Commonly known as:  BACTRIM DS,SEPTRA DS Take 1 tablet by mouth 2 (two) times daily.   tiotropium 18 MCG inhalation capsule Commonly known as:  SPIRIVA Place 1 capsule (18 mcg total) into inhaler and inhale daily.   valACYclovir 1000 MG tablet Commonly known as:  VALTREX Take 1 tablet (1,000 mg total) by mouth daily as needed. FLARE UPS        Review of  Systems  All other systems reviewed and are negative.      Objective:   Physical Exam  Constitutional: She is oriented to person, place, and time. She appears well-developed and well-nourished.  Cardiovascular: Normal rate, regular rhythm and normal heart sounds.  Pulmonary/Chest: Effort normal and breath sounds normal.  Abdominal: Soft. Bowel sounds are normal.  Neurological: She is alert and oriented to person, place, and time.  Psychiatric: She has a normal mood and affect. Her behavior is normal. Judgment and thought content normal.  Vitals reviewed.   BP (!) 142/80 (BP Location: Left Arm, Patient Position: Sitting)   Pulse 78   Temp 97.7 F (36.5 C)   Ht 5\' 4"   (1.626 m)   Wt 198 lb 8 oz (90 kg)   LMP 03/07/2012 (Approximate)   BMI 34.07 kg/m        Assessment & Plan:   Hypertriglyceridemia: Check LDL direct tonight.  Begin Lipitor 40mg  daily. Discussed with pt signs of heart attack and stroke and foods to watch.  Neuropathy and Sciatica:  Increase Gabapentin to 600mg  TID. Discussed with pt to monitor symptoms and to stop taking with any signs of confusion and dizziness.   F/u in 1 mo to evaluate med effectiveness.

## 2018-02-07 LAB — SPECIMEN STATUS

## 2018-02-09 LAB — SPECIMEN STATUS REPORT

## 2018-02-09 LAB — LDL CHOLESTEROL, DIRECT: LDL Direct: 94 mg/dL (ref 0–99)

## 2018-02-11 ENCOUNTER — Ambulatory Visit: Payer: PRIVATE HEALTH INSURANCE

## 2018-02-20 ENCOUNTER — Other Ambulatory Visit: Payer: Self-pay

## 2018-02-20 ENCOUNTER — Encounter: Payer: Self-pay | Admitting: *Deleted

## 2018-02-20 ENCOUNTER — Emergency Department
Admission: EM | Admit: 2018-02-20 | Discharge: 2018-02-20 | Disposition: A | Discharge status: Home / Self Care | Payer: No Typology Code available for payment source | Attending: Emergency Medicine | Admitting: Emergency Medicine

## 2018-02-20 ENCOUNTER — Emergency Department: Payer: No Typology Code available for payment source

## 2018-02-20 DIAGNOSIS — G44309 Post-traumatic headache, unspecified, not intractable: Secondary | ICD-10-CM | POA: Diagnosis not present

## 2018-02-20 DIAGNOSIS — Y92410 Unspecified street and highway as the place of occurrence of the external cause: Secondary | ICD-10-CM | POA: Diagnosis not present

## 2018-02-20 DIAGNOSIS — J449 Chronic obstructive pulmonary disease, unspecified: Secondary | ICD-10-CM | POA: Insufficient documentation

## 2018-02-20 DIAGNOSIS — S0990XA Unspecified injury of head, initial encounter: Secondary | ICD-10-CM | POA: Diagnosis present

## 2018-02-20 DIAGNOSIS — Z79899 Other long term (current) drug therapy: Secondary | ICD-10-CM | POA: Diagnosis not present

## 2018-02-20 DIAGNOSIS — Y9389 Activity, other specified: Secondary | ICD-10-CM | POA: Insufficient documentation

## 2018-02-20 DIAGNOSIS — S161XXA Strain of muscle, fascia and tendon at neck level, initial encounter: Secondary | ICD-10-CM | POA: Insufficient documentation

## 2018-02-20 DIAGNOSIS — Y999 Unspecified external cause status: Secondary | ICD-10-CM | POA: Insufficient documentation

## 2018-02-20 DIAGNOSIS — E039 Hypothyroidism, unspecified: Secondary | ICD-10-CM | POA: Insufficient documentation

## 2018-02-20 DIAGNOSIS — F17210 Nicotine dependence, cigarettes, uncomplicated: Secondary | ICD-10-CM | POA: Insufficient documentation

## 2018-02-20 DIAGNOSIS — I10 Essential (primary) hypertension: Secondary | ICD-10-CM | POA: Diagnosis not present

## 2018-02-20 MED ORDER — BACLOFEN 10 MG PO TABS: 10.0000 mg | ORAL_TABLET | Freq: Every day | ORAL | 1 refills | Status: DC

## 2018-02-20 MED ORDER — TRAMADOL HCL 50 MG PO TABS: 50.0000 mg | ORAL_TABLET | Freq: Four times a day (QID) | ORAL | 0 refills | Status: DC | PRN
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
## 2018-02-20 NOTE — Discharge Instructions (Addendum)
Follow-up with your regular doctor if not better in 5 7 days.  If you continue to have neck pain mucousy Dr. Mack Guise.  You would have to call and make an appointment.  Take medication as prescribed.  Apply ice to all areas that hurt.  Return to the emergency department for headache is worsening
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
## 2018-03-13 ENCOUNTER — Ambulatory Visit: Payer: PRIVATE HEALTH INSURANCE | Admitting: Adult Health Nurse Practitioner

## 2018-03-13 VITALS — BP 130/75 | HR 71 | Temp 97.6°F | Ht 64.5 in | Wt 197.8 lb

## 2018-03-13 DIAGNOSIS — R7309 Other abnormal glucose: Secondary | ICD-10-CM

## 2018-03-13 DIAGNOSIS — G629 Polyneuropathy, unspecified: Secondary | ICD-10-CM

## 2018-03-13 DIAGNOSIS — D72829 Elevated white blood cell count, unspecified: Secondary | ICD-10-CM | POA: Insufficient documentation

## 2018-03-13 MED ORDER — BACLOFEN 10 MG PO TABS: 10.0000 mg | ORAL_TABLET | Freq: Every day | ORAL | 1 refills | Status: DC
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
## 2018-03-14 LAB — COMPREHENSIVE METABOLIC PANEL
A/G RATIO: 1.3 (ref 1.2–2.2)
ALBUMIN: 4 g/dL (ref 3.5–5.5)
ALK PHOS: 76 IU/L (ref 39–117)
ALT: 9 IU/L (ref 0–32)
AST: 9 IU/L (ref 0–40)
BUN / CREAT RATIO: 14 (ref 9–23)
BUN: 16 mg/dL (ref 6–24)
CO2: 27 mmol/L (ref 20–29)
CREATININE: 1.12 mg/dL — AB (ref 0.57–1.00)
Calcium: 9.2 mg/dL (ref 8.7–10.2)
Chloride: 105 mmol/L (ref 96–106)
GFR calc Af Amer: 63 mL/min/{1.73_m2} (ref >59)
GFR calc non Af Amer: 55 mL/min/{1.73_m2} — ABNORMAL LOW (ref >59)
GLOBULIN, TOTAL: 3 g/dL (ref 1.5–4.5)
Glucose: 89 mg/dL (ref 65–99)
POTASSIUM: 4.5 mmol/L (ref 3.5–5.2)
SODIUM: 145 mmol/L — AB (ref 134–144)
Total Protein: 7 g/dL (ref 6.0–8.5)

## 2018-03-14 LAB — CBC WITH DIFFERENTIAL/PLATELET
BASOS: 1 %
Basophils Absolute: 0.1 10*3/uL (ref 0.0–0.2)
EOS (ABSOLUTE): 0.2 10*3/uL (ref 0.0–0.4)
EOS: 2 %
HEMATOCRIT: 43.1 % (ref 34.0–46.6)
Hemoglobin: 14.1 g/dL (ref 11.1–15.9)
IMMATURE GRANULOCYTES: 0 %
Immature Grans (Abs): 0 10*3/uL (ref 0.0–0.1)
LYMPHS ABS: 3.3 10*3/uL — AB (ref 0.7–3.1)
Lymphs: 35 %
MCH: 30.9 pg (ref 26.6–33.0)
MCHC: 32.7 g/dL (ref 31.5–35.7)
MCV: 95 fL (ref 79–97)
MONOS ABS: 0.6 10*3/uL (ref 0.1–0.9)
Monocytes: 6 %
NEUTROS PCT: 56 %
Neutrophils Absolute: 5.3 10*3/uL (ref 1.4–7.0)
Platelets: 361 10*3/uL (ref 150–450)
RBC: 4.56 x10E6/uL (ref 3.77–5.28)
RDW: 14.3 % (ref 12.3–15.4)
WBC: 9.5 10*3/uL (ref 3.4–10.8)

## 2018-03-14 LAB — HEMOGLOBIN A1C
Est. average glucose Bld gHb Est-mCnc: 114 mg/dL
Hgb A1c MFr Bld: 5.6 % (ref 4.8–5.6)

## 2018-04-22 ENCOUNTER — Other Ambulatory Visit: Payer: Self-pay | Admitting: Adult Health Nurse Practitioner

## 2018-04-22 ENCOUNTER — Other Ambulatory Visit: Payer: Self-pay | Admitting: Urology

## 2018-04-30 ENCOUNTER — Other Ambulatory Visit: Payer: Self-pay | Admitting: Internal Medicine

## 2018-05-07 ENCOUNTER — Other Ambulatory Visit: Payer: Self-pay | Admitting: Urology

## 2018-05-07 DIAGNOSIS — I10 Essential (primary) hypertension: Secondary | ICD-10-CM

## 2018-06-06 ENCOUNTER — Other Ambulatory Visit: Payer: Self-pay | Admitting: Internal Medicine

## 2018-06-06 DIAGNOSIS — I10 Essential (primary) hypertension: Secondary | ICD-10-CM

## 2018-06-12 ENCOUNTER — Ambulatory Visit: Payer: PRIVATE HEALTH INSURANCE | Admitting: Adult Health Nurse Practitioner

## 2018-06-12 VITALS — BP 129/85 | Temp 97.3°F | Wt 195.1 lb

## 2018-06-12 DIAGNOSIS — R079 Chest pain, unspecified: Secondary | ICD-10-CM | POA: Insufficient documentation

## 2018-06-12 DIAGNOSIS — R1013 Epigastric pain: Secondary | ICD-10-CM

## 2018-06-12 MED ORDER — PANTOPRAZOLE SODIUM 40 MG PO TBEC: 40.0000 mg | DELAYED_RELEASE_TABLET | Freq: Every day | ORAL | 3 refills | Status: DC

## 2018-06-12 MED ORDER — ONDANSETRON HCL 4 MG PO TABS: 4.0000 mg | ORAL_TABLET | Freq: Three times a day (TID) | ORAL | 0 refills | Status: AC | PRN
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
## 2018-06-12 NOTE — Patient Instructions (Signed)
Bland Diet  A bland diet consists of foods that are often soft and do not have a lot of fat, fiber, or extra seasonings. Foods without fat, fiber, or seasoning are easier for the body to digest. They are also less likely to irritate your mouth, throat, stomach, and other parts of your digestive system. A bland diet is sometimes called a BRAT diet.  What is my plan?  Your health care provider or food and nutrition specialist (dietitian) may recommend specific changes to your diet to prevent symptoms or to treat your symptoms. These changes may include:   Eating small meals often.   Cooking food until it is soft enough to chew easily.   Chewing your food well.   Drinking fluids slowly.   Not eating foods that are very spicy, sour, or fatty.   Not eating citrus fruits, such as oranges and grapefruit.  What do I need to know about this diet?   Eat a variety of foods from the bland diet food list.   Do not follow a bland diet longer than needed.   Ask your health care provider whether you should take vitamins or supplements.  What foods can I eat?  Grains    Hot cereals, such as cream of wheat. Rice. Bread, crackers, or tortillas made from refined white flour.  Vegetables  Canned or cooked vegetables. Mashed or boiled potatoes.  Fruits    Bananas. Applesauce. Other types of cooked or canned fruit with the skin and seeds removed, such as canned peaches or pears.  Meats and other proteins    Scrambled eggs. Creamy peanut butter or other nut butters. Lean, well-cooked meats, such as chicken or fish. Tofu. Soups or broths.  Dairy  Low-fat dairy products, such as milk, cottage cheese, or yogurt.  Beverages    Water. Herbal tea. Apple juice.  Fats and oils  Mild salad dressings. Canola or olive oil.  Sweets and desserts  Pudding. Custard. Fruit gelatin. Ice cream.  The items listed above may not be a complete list of recommended foods and beverages. Contact a dietitian for more options.  What foods are not  recommended?  Grains  Whole grain breads and cereals.  Vegetables  Raw vegetables.  Fruits  Raw fruits, especially citrus, berries, or dried fruits.  Dairy  Whole fat dairy foods.  Beverages  Caffeinated drinks. Alcohol.  Seasonings and condiments  Strongly flavored seasonings or condiments. Hot sauce. Salsa.  Other foods  Spicy foods. Fried foods. Sour foods, such as pickled or fermented foods. Foods with high sugar content. Foods high in fiber.  The items listed above may not be a complete list of foods and beverages to avoid. Contact a dietitian for more information.  Summary   A bland diet consists of foods that are often soft and do not have a lot of fat, fiber, or extra seasonings.   Foods without fat, fiber, or seasoning are easier for the body to digest.   Check with your health care provider to see how long you should follow this diet plan. It is not meant to be followed for long periods.  This information is not intended to replace advice given to you by your health care provider. Make sure you discuss any questions you have with your health care provider.  Document Released: 10/03/2015 Document Revised: 07/10/2017 Document Reviewed: 07/10/2017  Elsevier Interactive Patient Education  2019 Else[employer] Inc[employer]

## 2018-06-13 LAB — COMPREHENSIVE METABOLIC PANEL
ALT: 9 IU/L (ref 0–32)
AST: 11 IU/L (ref 0–40)
Albumin/Globulin Ratio: 1.3 (ref 1.2–2.2)
Albumin: 4 g/dL (ref 3.5–5.5)
Alkaline Phosphatase: 76 IU/L (ref 39–117)
BUN/Creatinine Ratio: 14 (ref 9–23)
BUN: 15 mg/dL (ref 6–24)
Bilirubin Total: <0.2 mg/dL (ref 0.0–1.2)
CALCIUM: 9.4 mg/dL (ref 8.7–10.2)
CO2: 27 mmol/L (ref 20–29)
CREATININE: 1.04 mg/dL — AB (ref 0.57–1.00)
Chloride: 104 mmol/L (ref 96–106)
GFR calc Af Amer: 69 mL/min/{1.73_m2} (ref >59)
GFR, EST NON AFRICAN AMERICAN: 60 mL/min/{1.73_m2} (ref >59)
GLOBULIN, TOTAL: 3.1 g/dL (ref 1.5–4.5)
Glucose: 92 mg/dL (ref 65–99)
Potassium: 5.2 mmol/L (ref 3.5–5.2)
SODIUM: 143 mmol/L (ref 134–144)
TOTAL PROTEIN: 7.1 g/dL (ref 6.0–8.5)

## 2018-06-13 LAB — CBC
HEMATOCRIT: 43.4 % (ref 34.0–46.6)
Hemoglobin: 14.2 g/dL (ref 11.1–15.9)
MCH: 30.4 pg (ref 26.6–33.0)
MCHC: 32.7 g/dL (ref 31.5–35.7)
MCV: 93 fL (ref 79–97)
Platelets: 348 10*3/uL (ref 150–450)
RBC: 4.67 x10E6/uL (ref 3.77–5.28)
RDW: 13.3 % (ref 12.3–15.4)
WBC: 9.7 10*3/uL (ref 3.4–10.8)

## 2018-06-13 LAB — LIPASE: Lipase: 31 U/L (ref 14–72)

## 2018-06-13 LAB — AMYLASE: AMYLASE: 68 U/L (ref 31–124)

## 2018-07-01 ENCOUNTER — Ambulatory Visit: Payer: PRIVATE HEALTH INSURANCE | Admitting: Licensed Clinical Social Worker

## 2018-07-01 ENCOUNTER — Other Ambulatory Visit: Payer: Self-pay | Admitting: Internal Medicine

## 2018-07-01 DIAGNOSIS — F4310 Post-traumatic stress disorder, unspecified: Secondary | ICD-10-CM

## 2018-07-01 DIAGNOSIS — F4001 Agoraphobia with panic disorder: Secondary | ICD-10-CM

## 2018-07-01 DIAGNOSIS — F333 Major depressive disorder, recurrent, severe with psychotic symptoms: Secondary | ICD-10-CM
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
## 2018-07-03 ENCOUNTER — Ambulatory Visit: Payer: PRIVATE HEALTH INSURANCE | Admitting: Adult Health Nurse Practitioner

## 2018-07-03 ENCOUNTER — Other Ambulatory Visit: Payer: Self-pay | Admitting: Adult Health Nurse Practitioner

## 2018-07-03 VITALS — BP 151/85 | HR 72 | Temp 97.5°F | Ht 64.5 in | Wt 191.8 lb

## 2018-07-03 DIAGNOSIS — I10 Essential (primary) hypertension: Secondary | ICD-10-CM

## 2018-07-03 MED ORDER — GABAPENTIN 600 MG PO TABS: 600.0000 mg | ORAL_TABLET | Freq: Three times a day (TID) | ORAL | 2 refills | Status: DC

## 2018-07-03 MED ORDER — LISINOPRIL 10 MG PO TABS: 10.0000 mg | ORAL_TABLET | Freq: Every day | ORAL | 3 refills | Status: DC

## 2018-07-03 MED ORDER — PANTOPRAZOLE SODIUM 40 MG PO TBEC: 40.0000 mg | DELAYED_RELEASE_TABLET | Freq: Every day | ORAL | 3 refills | Status: DC

## 2018-07-03 NOTE — Progress Notes (Signed)
Patient: Brittney Chavez Female    DOB: Dec 17, 1960   58 y.o.   MRN: [id number] Visit Date: 07/03/2018  Today's Provider: Staci Acosta, NP   Chief Complaint  Patient presents with  . Follow-up    patient has extreme headache   Subjective:    HPI  Headache for the past few days, with pain behind the eyes.  Blurry vision with the headache. Slight dizziness. States that the headache has been intense- minimally relieved with 800mg  ibuprofen. Tylenol is not helpful. States that her eyes are sore. Endorses rhinorrhea- clear. Taking sinus medications with no relief. States she has some ringing in her ears.    Not on any BP medications. Was taken off HCTZ due to risk for increased Triglycerides and hypotension.     Allergies  Allergen Reactions  . Bee Pollen Shortness Of Breath and Swelling  . Gabapentin Other (See Comments)    Causes confusion  . Nsaids Nausea Only    Other reaction(s): OTHER Blisters in mouth  . Pollen Extract    Previous Medications   ACETAMINOPHEN (TYLENOL) 500 MG TABLET    Take 500 mg by mouth every 6 (six) hours as needed for mild pain.    ATORVASTATIN (LIPITOR) 40 MG TABLET    Take 1 tablet (40 mg total) by mouth daily.   BACLOFEN (LIORESAL) 10 MG TABLET    Take 1 tablet (10 mg total) by mouth daily.   CETIRIZINE (ZYRTEC) 10 MG TABLET    Take 1 tablet (10 mg total) by mouth daily.   CONJUGATED ESTROGENS (PREMARIN) VAGINAL CREAM    1-2 gram intravaginal 2 times a week   GABAPENTIN (NEURONTIN) 600 MG TABLET    Take 1 tablet (600 mg total) by mouth 3 (three) times daily.   LEVOTHYROXINE (SYNTHROID, LEVOTHROID) 100 MCG TABLET    TAKE ONE TABLET BY MOUTH EVERY DAY BEFORE BREAKFAST   ONDANSETRON (ZOFRAN) 4 MG TABLET    Take 1 tablet (4 mg total) by mouth every 8 (eight) hours as needed for nausea or vomiting.   PANTOPRAZOLE (PROTONIX) 40 MG TABLET    Take 1 tablet (40 mg total) by mouth daily.   PROAIR HFA 108 (90 BASE) MCG/ACT INHALER    INHALE 2 PUFFS INTO THE  LUNGS EVERY 6 HOURS AS NEEDED FOR WHEEZING ORSHORTNESS OF BREATH   PROAIR HFA 108 (90 BASE) MCG/ACT INHALER    INHALE 2 PUFFS INTO THE LUNGS EVERY 6 HOURS AS NEEDED FOR WHEEZING ORSHORTNESS OF BREATH   TIOTROPIUM (SPIRIVA) 18 MCG INHALATION CAPSULE    Place 1 capsule (18 mcg total) into inhaler and inhale daily.   VALACYCLOVIR (VALTREX) 1000 MG TABLET    Take 1 tablet (1,000 mg total) by mouth daily as needed. FLARE UPS    Review of Systems  Social History   Tobacco Use  . Smoking status: Current Every Day Smoker    Packs/day: 0.25    Years: 30.00    Pack years: 7.50    Types: Cigarettes  . Smokeless tobacco: Never Used  Substance Use Topics  . Alcohol use: No   Objective:   BP (!) 151/85 (BP Location: Left Arm, Patient Position: Sitting)   Pulse 72   Temp (!) 97.5 F (36.4 C) (Oral)   Ht 5' 4.5" (1.638 m)   Wt 191 lb 12.8 oz (87 kg)   LMP 03/07/2012 (Approximate)   BMI 32.41 kg/m   Physical Exam Vitals signs reviewed.  Constitutional:      Appearance: Normal appearance.  HENT:     Head: Normocephalic.     Right Ear: Hearing, tympanic membrane, ear canal and external ear normal.     Left Ear: Hearing, tympanic membrane, ear canal and external ear normal.     Nose:     Right Sinus: No maxillary sinus tenderness or frontal sinus tenderness.     Left Sinus: No maxillary sinus tenderness or frontal sinus tenderness.     Mouth/Throat:     Mouth: Mucous membranes are moist.     Pharynx: Oropharynx is clear.  Cardiovascular:     Rate and Rhythm: Normal rate and regular rhythm.  Pulmonary:     Effort: Pulmonary effort is normal.     Breath sounds: Normal breath sounds.  Abdominal:     General: Bowel sounds are normal.  Lymphadenopathy:     Cervical: No cervical adenopathy.  Skin:    General: Skin is warm and dry.  Neurological:     Mental Status: She is alert and oriented to person, place, and time.         Assessment & Plan:        Reviewed labs.  Continue  supportive care with OTC allergra and flonase.  If symptoms persist RTC.   Will restart BP medications- Lisinopril 10mg  daily.  FU in 4 weeks BP check.    Staci Acosta, NP   [facility]

## 2018-07-08 ENCOUNTER — Ambulatory Visit: Payer: PRIVATE HEALTH INSURANCE | Admitting: Licensed Clinical Social Worker

## 2018-07-10 ENCOUNTER — Ambulatory Visit: Payer: PRIVATE HEALTH INSURANCE | Admitting: Ophthalmology

## 2018-07-15 ENCOUNTER — Telehealth: Payer: Self-pay | Admitting: Pharmacist

## 2018-07-15 NOTE — Telephone Encounter (Signed)
07/15/2018 8:28:01 AM - ProAir/Teva renewal   07/15/2018 Faxed Teva renewal application for [employer] HFA 0.09mg  Inhale 2 puffs every 4 hours.Delos Haring

## 2018-07-31 ENCOUNTER — Ambulatory Visit: Payer: PRIVATE HEALTH INSURANCE

## 2018-08-07 ENCOUNTER — Ambulatory Visit: Payer: PRIVATE HEALTH INSURANCE | Admitting: Adult Health Nurse Practitioner

## 2018-08-07 ENCOUNTER — Ambulatory Visit: Payer: PRIVATE HEALTH INSURANCE

## 2018-08-07 ENCOUNTER — Ambulatory Visit: Payer: PRIVATE HEALTH INSURANCE | Admitting: Licensed Clinical Social Worker

## 2018-08-07 VITALS — BP 155/101 | HR 71 | Temp 97.5°F | Ht 64.0 in | Wt 187.4 lb

## 2018-08-07 DIAGNOSIS — J441 Chronic obstructive pulmonary disease with (acute) exacerbation: Secondary | ICD-10-CM

## 2018-08-07 DIAGNOSIS — I10 Essential (primary) hypertension: Secondary | ICD-10-CM

## 2018-08-07 DIAGNOSIS — F4310 Post-traumatic stress disorder, unspecified: Secondary | ICD-10-CM

## 2018-08-07 DIAGNOSIS — F333 Major depressive disorder, recurrent, severe with psychotic symptoms: Secondary | ICD-10-CM

## 2018-08-07 DIAGNOSIS — K219 Gastro-esophageal reflux disease without esophagitis: Secondary | ICD-10-CM

## 2018-08-07 DIAGNOSIS — F4001 Agoraphobia with panic disorder: Secondary | ICD-10-CM

## 2018-08-07 MED ORDER — LISINOPRIL 20 MG PO TABS: 20.0000 mg | ORAL_TABLET | Freq: Every day | ORAL | 3 refills | Status: DC

## 2018-08-07 MED ORDER — PANTOPRAZOLE SODIUM 40 MG PO TBEC: 40.0000 mg | DELAYED_RELEASE_TABLET | Freq: Two times a day (BID) | ORAL | 0 refills | Status: DC

## 2018-08-07 MED ORDER — AZITHROMYCIN 250 MG PO TABS: ORAL_TABLET | ORAL | 0 refills | Status: DC

## 2018-08-07 MED ORDER — PREDNISONE 20 MG PO TABS: 40.0000 mg | ORAL_TABLET | Freq: Every day | ORAL | 0 refills | Status: DC

## 2018-08-07 NOTE — Progress Notes (Signed)
Patient: Brittney Chavez Female    DOB: 1961/05/20   58 y.o.   MRN: [id number] Visit Date: 08/07/2018  Today's Provider: [place]   Chief Complaint  Patient presents with  . Follow-up    lower back and hip pain, headache, congestion, ringing in ears, hear water and found blood in ear   Subjective:    HPI  Started Lisinopril for BP last BP was 151/85.  States that the ringing in her ears has gotten louder- thinks it is secondary to her BP running high.  States that the top of her head hurts.   States that GERD is worse at night- it is awakening her. Current medication is not helping. Has been taking increased NSAIDS due to pain.   On waitlist for pain management for chronic pain that is affecting her mobility. States that she is having pain in her hips and back- hx of Degenerative Disc.   States that her nose is staying "stopped up"   Physiological scientist for anxiety, PTSD and panic disorder.      Allergies  Allergen Reactions  . Bee Pollen Shortness Of Breath and Swelling  . Gabapentin Other (See Comments)    Causes confusion  . Nsaids Nausea Only    Other reaction(s): OTHER Blisters in mouth  . Pollen Extract    Previous Medications   ACETAMINOPHEN (TYLENOL) 500 MG TABLET    Take 500 mg by mouth every 6 (six) hours as needed for mild pain.    ATORVASTATIN (LIPITOR) 40 MG TABLET    Take 1 tablet (40 mg total) by mouth daily.   BACLOFEN (LIORESAL) 10 MG TABLET    Take 1 tablet (10 mg total) by mouth daily.   CETIRIZINE (ZYRTEC) 10 MG TABLET    Take 1 tablet (10 mg total) by mouth daily.   CONJUGATED ESTROGENS (PREMARIN) VAGINAL CREAM    1-2 gram intravaginal 2 times a week   GABAPENTIN (NEURONTIN) 600 MG TABLET    Take 1 tablet (600 mg total) by mouth 3 (three) times daily.   LEVOTHYROXINE (SYNTHROID, LEVOTHROID) 100 MCG TABLET    TAKE ONE TABLET BY MOUTH EVERY DAY BEFORE BREAKFAST   LISINOPRIL (PRINIVIL,ZESTRIL) 10 MG TABLET    Take 1 tablet (10 mg total) by  mouth daily.   ONDANSETRON (ZOFRAN) 4 MG TABLET    Take 1 tablet (4 mg total) by mouth every 8 (eight) hours as needed for nausea or vomiting.   PANTOPRAZOLE (PROTONIX) 40 MG TABLET    Take 1 tablet (40 mg total) by mouth daily.   PROAIR HFA 108 (90 BASE) MCG/ACT INHALER    INHALE 2 PUFFS INTO THE LUNGS EVERY 6 HOURS AS NEEDED FOR WHEEZING ORSHORTNESS OF BREATH   PROAIR HFA 108 (90 BASE) MCG/ACT INHALER    INHALE 2 PUFFS INTO THE LUNGS EVERY 6 HOURS AS NEEDED FOR WHEEZING ORSHORTNESS OF BREATH   TIOTROPIUM (SPIRIVA) 18 MCG INHALATION CAPSULE    Place 1 capsule (18 mcg total) into inhaler and inhale daily.   VALACYCLOVIR (VALTREX) 1000 MG TABLET    Take 1 tablet (1,000 mg total) by mouth daily as needed. FLARE UPS    Review of Systems  HENT: Positive for congestion and ear pain.   Neurological: Positive for headaches.    Social History   Tobacco Use  . Smoking status: Current Every Day Smoker    Packs/day: 0.25    Years: 30.00    Pack years: 7.50    Types: Cigarettes  .  Smokeless tobacco: Never Used  Substance Use Topics  . Alcohol use: No   Objective:   BP (!) 155/101 (BP Location: Left Arm, Patient Position: Sitting, Cuff Size: Normal)   Pulse 71   Temp (!) 97.5 F (36.4 C)   Ht 5\' 4"  (1.626 m)   Wt 187 lb 6.4 oz (85 kg)   LMP 03/07/2012 (Approximate)   BMI 32.17 kg/m   Physical Exam Vitals signs reviewed.  Constitutional:      Appearance: Normal appearance.  HENT:     Head: Normocephalic.     Right Ear: Tympanic membrane, ear canal and external ear normal.     Left Ear: Tympanic membrane, ear canal and external ear normal.     Nose: Congestion present.     Right Sinus: No maxillary sinus tenderness or frontal sinus tenderness.     Left Sinus: No maxillary sinus tenderness or frontal sinus tenderness.     Mouth/Throat:     Mouth: Mucous membranes are dry.  Cardiovascular:     Rate and Rhythm: Normal rate and regular rhythm.     Pulses: Normal pulses.     Heart  sounds: Normal heart sounds.  Pulmonary:     Effort: Pulmonary effort is normal.     Breath sounds: Normal breath sounds.  Abdominal:     General: Bowel sounds are normal.     Palpations: Abdomen is soft.  Neurological:     Mental Status: She is alert.         Assessment & Plan:         HTN:  Not controlled.  Goal BP <140/90.  Increase Lisinopril to 20mg  daily.  Encourage low salt diet and exercise.   Continue application for pain management for DDD and arthritis.  Will give steroids to try to give some pain relief.   GERD: Increase Protonix to 40mg  BID x 2 weeks.  Try to avoid NSAID use and triggers.  Pt to have H Pylori test next week.   COPD exacerbation:  Off Spirva due to cost- awaiting charity care  Prednisone 40mg  daily x 5 days.  Z-pack.    FU in 3 weeks for BP check/check BMP and TSH      [facility]

## 2018-08-07 NOTE — BH Specialist Note (Signed)
Integrated Behavioral Health Follow Up Visit  MRN: [id number] Name: Brittney Chavez  Number of [place] Clinician visits: 1/6  Type of Service: [place] Interpretor:No. Interpretor Name and Language: Not applicable.  SUBJECTIVE: Brittney Chavez is a 58 y.o. female accompanied by herself. Patient was referred by Staci Acosta NP for mental health. Patient reports the following symptoms/concerns: She reports that she has been cancelling her appointments due to the level of anxiety that she has been experiencing and her shut in spells are becoming more frequent. She describes forgetting to breath and will sometimes gasp for air. She notes that she has not been practicing her hygiene is often due to the level of pain she is experiencing and that it literally hurts her when water hits her skin in the shower. She notes that she is not sleeping well at night and feels fatigued all the time. She notes that she is not eating enough as she should because her daughter is the only one working and money is tight. She explains that she will become agitated more easily over little things. She notes that she vent to her daughter so her anger does not get out of control. She denies suicidal and homicidal thoughts.  Duration of problem:  Severity of problem: severe  OBJECTIVE: Mood: Anxious and Depressed and Affect: Appropriate Risk of harm to self or others: No plan to harm self or others  LIFE CONTEXT: Family and Social: Ms. Lomeli lives with her daughter and a roommate. School/Work: Ms. Musto has applied for disability and has a hearing scheduled in April. Self-Care: See above. Life Changes: See above.  GOALS ADDRESSED: Patient will: 1.  Reduce symptoms of: anxiety, depression and mood instability  2.  Increase knowledge and/or ability of: coping skills, healthy habits, self-management skills and stress reduction  3.  Demonstrate  ability to: Increase healthy adjustment to current life circumstances  INTERVENTIONS: Interventions utilized:  Solution-Focused Strategies and Brief CBT was utilized by the clinician focusing on the patient's symptoms of anxiety and depression. Clinician processed with the patient regarding how she has been doing since the last follow up session. Clinician discussed with the patient that stressors that are contributing to her symptoms of anxiety and depression. Clinician explained that not doing her hygiene or practicing self care is not going to improve her symptoms of depression but make them worsen. Clinician discussed with the patient that concept of behavioral activation; having a routine for the morning, afternoon, and evening. Clinician explained that at least spot washing, brushing her teeth, combing her hair, and putting on clean clothes will make her feel a lot better. Clinician asked the patient if she has ever tried guided imagery or grounding techniques. Clinician provided the patient an overview and handout detailing the concepts to try.  Standardized Assessments completed: next session.  ASSESSMENT: Patient currently experiencing symptoms of anxiety and depression due to chronic pain.   Patient may benefit from continuing with therapy.Marland Kitchen  PLAN: 1. Follow up with behavioral health clinician on : 1 week. 2. Behavioral recommendations: Behavioral activation in terms of establishing a routine. Guided imagery and grounding techniques for anxiety. Case Consultation with Dr. Octavia Heir, MD, Psychiatric consultant on 08/13/2018 to discuss plan of care and treatment options.  3. Referral(s): [place] (In Clinic) 4. "From scale of 1-10, how likely are you to follow plan?": [place], LCSW

## 2018-08-14 ENCOUNTER — Ambulatory Visit: Payer: PRIVATE HEALTH INSURANCE | Admitting: Ophthalmology

## 2018-08-14 ENCOUNTER — Ambulatory Visit: Payer: PRIVATE HEALTH INSURANCE | Admitting: Licensed Clinical Social Worker

## 2018-08-28 ENCOUNTER — Telehealth: Payer: Self-pay | Admitting: Licensed Clinical Social Worker

## 2018-08-28 ENCOUNTER — Ambulatory Visit: Payer: PRIVATE HEALTH INSURANCE | Admitting: Adult Health Nurse Practitioner

## 2018-08-28 ENCOUNTER — Other Ambulatory Visit: Payer: Self-pay

## 2018-08-28 VITALS — BP 144/88 | HR 64 | Temp 97.7°F | Ht 65.0 in | Wt 184.7 lb

## 2018-08-28 DIAGNOSIS — M25521 Pain in right elbow: Secondary | ICD-10-CM

## 2018-08-28 DIAGNOSIS — I10 Essential (primary) hypertension: Secondary | ICD-10-CM

## 2018-08-28 DIAGNOSIS — Z8639 Personal history of other endocrine, nutritional and metabolic disease: Secondary | ICD-10-CM

## 2018-08-28 MED ORDER — LISINOPRIL 40 MG PO TABS: 40.0000 mg | ORAL_TABLET | Freq: Every day | ORAL | 3 refills | Status: DC

## 2018-08-28 MED ORDER — TIOTROPIUM BROMIDE MONOHYDRATE 18 MCG IN CAPS: 18.0000 ug | ORAL_CAPSULE | Freq: Every day | RESPIRATORY_TRACT | 12 refills | Status: DC

## 2018-08-28 MED ORDER — BUSPIRONE HCL 5 MG PO TABS: 5.0000 mg | ORAL_TABLET | Freq: Two times a day (BID) | ORAL | 1 refills | Status: DC

## 2018-08-28 NOTE — Progress Notes (Signed)
Patient: Brittney Chavez Female    DOB: 05-Oct-1960   58 y.o.   MRN: [id number] Visit Date: 08/28/2018  Today's Provider: Staci Acosta, NP   Chief Complaint  Patient presents with  . Follow-up    blood pressure and right arm pain   Subjective:    HPI   Here for BP FU- last visit BP ws 155/101Lisinopril was increased to 20mg  daily.  States that BP is averaging 188-416 systolic at home.   Pt states that her right elbow is causing her pain- started 3-4 days ago. Unaware of any trauma. "feels bruised".  States that her anxiety is getting worse, she gets aggravated easily and stressed out. States that she is having "conversations" in her mind. States she is trying the guided imagery and medication but cannot get her mind to "stop running" Has never been on medications to manage- states that she has always been able to manage without pharmalogical interventions.     Allergies  Allergen Reactions  . Bee Pollen Shortness Of Breath and Swelling  . Gabapentin Other (See Comments)    Causes confusion  . Nsaids Nausea Only    Other reaction(s): OTHER Blisters in mouth  . Pollen Extract    Previous Medications   ACETAMINOPHEN (TYLENOL) 500 MG TABLET    Take 500 mg by mouth every 6 (six) hours as needed for mild pain.    ATORVASTATIN (LIPITOR) 40 MG TABLET    Take 1 tablet (40 mg total) by mouth daily.   AZITHROMYCIN (ZITHROMAX) 250 MG TABLET    Two tablets day 1 and then 1 tablet daily until gone   BACLOFEN (LIORESAL) 10 MG TABLET    Take 1 tablet (10 mg total) by mouth daily.   CETIRIZINE (ZYRTEC) 10 MG TABLET    Take 1 tablet (10 mg total) by mouth daily.   CONJUGATED ESTROGENS (PREMARIN) VAGINAL CREAM    1-2 gram intravaginal 2 times a week   GABAPENTIN (NEURONTIN) 600 MG TABLET    Take 1 tablet (600 mg total) by mouth 3 (three) times daily.   LEVOTHYROXINE (SYNTHROID, LEVOTHROID) 100 MCG TABLET    TAKE ONE TABLET BY MOUTH EVERY DAY BEFORE BREAKFAST   LISINOPRIL (PRINIVIL,ZESTRIL)  20 MG TABLET    Take 1 tablet (20 mg total) by mouth daily.   ONDANSETRON (ZOFRAN) 4 MG TABLET    Take 1 tablet (4 mg total) by mouth every 8 (eight) hours as needed for nausea or vomiting.   PANTOPRAZOLE (PROTONIX) 40 MG TABLET    Take 1 tablet (40 mg total) by mouth 2 (two) times daily for 14 days.   PREDNISONE (DELTASONE) 20 MG TABLET    Take 2 tablets (40 mg total) by mouth daily with breakfast.   PROAIR HFA 108 (90 BASE) MCG/ACT INHALER    INHALE 2 PUFFS INTO THE LUNGS EVERY 6 HOURS AS NEEDED FOR WHEEZING ORSHORTNESS OF BREATH   PROAIR HFA 108 (90 BASE) MCG/ACT INHALER    INHALE 2 PUFFS INTO THE LUNGS EVERY 6 HOURS AS NEEDED FOR WHEEZING ORSHORTNESS OF BREATH   TIOTROPIUM (SPIRIVA) 18 MCG INHALATION CAPSULE    Place 1 capsule (18 mcg total) into inhaler and inhale daily.   VALACYCLOVIR (VALTREX) 1000 MG TABLET    Take 1 tablet (1,000 mg total) by mouth daily as needed. FLARE UPS    Review of Systems  All other systems reviewed and are negative.   Social History   Tobacco Use  . Smoking status: Current Every Day Smoker  Packs/day: 0.25    Years: 30.00    Pack years: 7.50    Types: Cigarettes  . Smokeless tobacco: Never Used  Substance Use Topics  . Alcohol use: No   Objective:   BP (!) 144/88 (BP Location: Left Arm, Patient Position: Sitting, Cuff Size: Normal)   Pulse 64   Temp 97.7 F (36.5 C) (Oral)   Ht 5\' 5"  (1.651 m)   Wt 184 lb 11.2 oz (83.8 kg)   LMP 03/07/2012 (Approximate)   BMI 30.74 kg/m   Physical Exam Vitals signs reviewed.  Constitutional:      Appearance: Normal appearance.  Cardiovascular:     Rate and Rhythm: Normal rate and regular rhythm.  Pulmonary:     Effort: Pulmonary effort is normal.     Breath sounds: Normal breath sounds.  Neurological:     Mental Status: She is alert.  Psychiatric:        Mood and Affect: Mood is anxious.         Assessment & Plan:          Start Buspar 5mg  BID- with anticipation of increased dosing to 10mg   BID upon visit with Heather.  Melatonin OTC nightly for sleep. Recommend 5mg .  FU with Heather needed ASAP.   HTN:  Borderline. Anxiety may be driving up BP- anticipate being able to lower medications once anxiety is under control.    Goal BP <140/80.  Increase Lisinopril to 40mg  daily.  Encourage low salt diet and exercise.   Elbow xray for right elbow pain.    Staci Acosta, NP   [facility]

## 2018-08-29 LAB — TSH: TSH: 2.44 u[IU]/mL (ref 0.450–4.500)

## 2018-08-29 LAB — BASIC METABOLIC PANEL
BUN/Creatinine Ratio: 12 (ref 9–23)
BUN: 14 mg/dL (ref 6–24)
CALCIUM: 9.8 mg/dL (ref 8.7–10.2)
CO2: 27 mmol/L (ref 20–29)
Chloride: 105 mmol/L (ref 96–106)
Creatinine, Ser: 1.17 mg/dL — ABNORMAL HIGH (ref 0.57–1.00)
GFR calc non Af Amer: 52 mL/min/{1.73_m2} — ABNORMAL LOW (ref >59)
GFR, EST AFRICAN AMERICAN: 60 mL/min/{1.73_m2} (ref >59)
GLUCOSE: 86 mg/dL (ref 65–99)
Potassium: 5.2 mmol/L (ref 3.5–5.2)
Sodium: 147 mmol/L — ABNORMAL HIGH (ref 134–144)

## 2018-09-02 ENCOUNTER — Other Ambulatory Visit: Payer: Self-pay

## 2018-09-02 ENCOUNTER — Ambulatory Visit: Payer: PRIVATE HEALTH INSURANCE | Admitting: Licensed Clinical Social Worker

## 2018-09-02 DIAGNOSIS — F333 Major depressive disorder, recurrent, severe with psychotic symptoms: Secondary | ICD-10-CM

## 2018-09-02 DIAGNOSIS — F4001 Agoraphobia with panic disorder: Secondary | ICD-10-CM

## 2018-09-02 DIAGNOSIS — F4310 Post-traumatic stress disorder, unspecified: Secondary | ICD-10-CM

## 2018-09-02 MED ORDER — TIOTROPIUM BROMIDE MONOHYDRATE 18 MCG IN CAPS: 18.0000 ug | ORAL_CAPSULE | Freq: Every day | RESPIRATORY_TRACT | 12 refills | Status: AC

## 2018-09-02 NOTE — Telephone Encounter (Signed)
Provider error didn't mean to open chart.

## 2018-09-02 NOTE — BH Specialist Note (Signed)
Integrated Behavioral Health Follow Up Visit  MRN: [id number] Name: Brittney Chavez  Number of [place] Clinician visits: 3/6  Type of Service: [place] Interpretor:No. Interpretor Name and Language: Not applicable.   SUBJECTIVE: Brittney Chavez is a 58 y.o. female accompanied by herself. Patient was referred by Staci Acosta, NP for mental health. Patient reports the following symptoms/concerns: She reports that she has been feeling like she is a fog, exhausted, and tried a good portion of the time. She explains that she has noticed a slight difference in her symptoms of anxiety since starting the Buspar but wishes it was less frequent. She explains that she has been trying the techniques discussed at the last session but its hard to relax long enough to practice and its too late when her panic attacks come on so suddenly. She reports that she is no longer having thoughts of hurting people and is a lot less angry. She describes not feeling like herself for the last couple of months and is frustrated. She denies suicidal and homicidal thoughts.  Duration of problem: ; Severity of problem: severe  OBJECTIVE: Mood: Anxious and Affect: Appropriate Risk of harm to self or others: No plan to harm self or others  LIFE CONTEXT: Family and Social: Ms. Pates feels like a burden to her daughter due to not being able to work. School/Work: Ms. Hitt has a disability hearing scheduled for March. Self-Care: Ms. Askin is trying to practice self care. Life Changes: see above.  GOALS ADDRESSED: Patient will: 1.  Reduce symptoms of: anxiety  2.  Increase knowledge and/or ability of: coping skills  3.  Demonstrate ability to: Increase healthy adjustment to current life circumstances  INTERVENTIONS: Interventions utilized:  Brief CBT was utilized by the clinician focusing on the patient's anxiety and affect on normal cognition.  Clinician processed with the patient regarding how she has been doing since the last follow up session. Clinician discussed with the patient regarding the medication that Teah NP placed on her in the last week and asked if she has noticed in difference in her level of anxiety symptoms. Clinician explained that the fogginess could be the current dosage of gabapentin. Clinician explained that she would talk with Dr. Octavia Heir, MD, psychiatric consultant tomorrow regarding the medication she has been placed on and her symptoms. Clinician suggested that the patient not put too much pressure on herself in terms of accomplishing tasks at home by taking things one step at a time. Clinician suggested that the patient practice her coping skills when she does have a relaxed moment to be able to utilize them when a panic attack comes on.  Standardized Assessments completed: GAD-7 and PHQ 9 Anxiety is at a 17 and depression is at a 15. ASSESSMENT: Patient currently experiencing symptoms of anxiety due to health problems, stressors at home, and life in general.   Patient may benefit from continuing with therapy.  PLAN: 1. Follow up with behavioral health clinician on : one week or earlier if needed. 2. Behavioral recommendations: Continue with coping skills. Case consultation with Dr. Octavia Heir, MD, psychiatric consultant on 09/02/2018. 3. Referral(s): [place] (In Clinic) 4. "From scale of 1-10, how likely are you to follow plan?": [place], LCSW

## 2018-09-02 NOTE — Progress Notes (Signed)
Resent Spiriva Rx to Medication Management by fax.

## 2018-09-03 ENCOUNTER — Telehealth: Payer: Self-pay | Admitting: Pharmacist

## 2018-09-03 ENCOUNTER — Encounter: Payer: Self-pay | Admitting: Licensed Clinical Social Worker

## 2018-09-03 ENCOUNTER — Ambulatory Visit
Admission: RE | Admit: 2018-09-03 | Discharge: 2018-09-03 | Disposition: A | Discharge status: Home / Self Care | Payer: Self-pay | Source: Ambulatory Visit | Attending: Adult Health Nurse Practitioner | Admitting: Adult Health Nurse Practitioner

## 2018-09-03 ENCOUNTER — Other Ambulatory Visit: Payer: Self-pay

## 2018-09-03 DIAGNOSIS — M25521 Pain in right elbow: Secondary | ICD-10-CM | POA: Insufficient documentation

## 2018-09-03 NOTE — Telephone Encounter (Signed)
09/03/2018 1:52:25 PM - Spiriva Handihaler  09/03/2018 I have received a pharmacy printout for Spiriva Handihaler Inhale the contents of 1 capsule daily using handihaler. Printed [employer] application sending to [facility] for provider to sign, also mailing patient her portion to sign, need 2020 income/support, taxes/[id number] & [employer] form to sign for medication to be shipped to our office. Delos Haring

## 2018-09-08 ENCOUNTER — Emergency Department
Admission: EM | Admit: 2018-09-08 | Discharge: 2018-09-08 | Disposition: A | Discharge status: Home / Self Care | Payer: Self-pay | Attending: Emergency Medicine | Admitting: Emergency Medicine

## 2018-09-08 ENCOUNTER — Emergency Department: Payer: Self-pay

## 2018-09-08 ENCOUNTER — Other Ambulatory Visit: Payer: Self-pay

## 2018-09-08 ENCOUNTER — Encounter: Payer: Self-pay | Admitting: Intensive Care

## 2018-09-08 DIAGNOSIS — I10 Essential (primary) hypertension: Secondary | ICD-10-CM | POA: Insufficient documentation

## 2018-09-08 DIAGNOSIS — J069 Acute upper respiratory infection, unspecified: Secondary | ICD-10-CM | POA: Insufficient documentation

## 2018-09-08 DIAGNOSIS — F17210 Nicotine dependence, cigarettes, uncomplicated: Secondary | ICD-10-CM | POA: Insufficient documentation

## 2018-09-08 DIAGNOSIS — J449 Chronic obstructive pulmonary disease, unspecified: Secondary | ICD-10-CM | POA: Insufficient documentation

## 2018-09-08 DIAGNOSIS — Z79899 Other long term (current) drug therapy: Secondary | ICD-10-CM | POA: Insufficient documentation

## 2018-09-08 DIAGNOSIS — E039 Hypothyroidism, unspecified: Secondary | ICD-10-CM | POA: Insufficient documentation

## 2018-09-08 HISTORY — DX: Essential (primary) hypertension: I10

## 2018-09-08 HISTORY — DX: Chronic obstructive pulmonary disease, unspecified: J44.9

## 2018-09-08 LAB — INFLUENZA PANEL BY PCR (TYPE A & B)
INFLAPCR: NEGATIVE
INFLBPCR: NEGATIVE

## 2018-09-08 MED ORDER — BENZONATATE 100 MG PO CAPS: 200.0000 mg | ORAL_CAPSULE | Freq: Three times a day (TID) | ORAL | 0 refills | Status: DC | PRN

## 2018-09-08 MED ORDER — HYDROCOD POLST-CPM POLST ER 10-8 MG/5ML PO SUER: 5.0000 mL | Freq: Every evening | ORAL | 0 refills | Status: DC | PRN

## 2018-09-08 MED ORDER — FEXOFENADINE-PSEUDOEPHED ER 60-120 MG PO TB12: 1.0000 | ORAL_TABLET | Freq: Two times a day (BID) | ORAL | 0 refills | Status: AC

## 2018-09-08 NOTE — ED Notes (Signed)
See triage note  Presents with cough for about 2 weeks with sore throat  states she developed body aches about 4 days ago  Cough is prod  Unsure of fever but is afebrile on arrival

## 2018-09-08 NOTE — ED Provider Notes (Signed)
[facility] Emergency Department Provider Note   ____________________________________________   First MD Initiated Contact with Patient 09/08/18 1257     (approximate)  I have reviewed the triage vital signs and the nursing notes.   HISTORY  Chief Complaint Influenza    HPI Brittney Chavez is a 58 y.o. female patient presents with body aches, chills, cough, sore throat, and rib pain secondary to cough.  Patient state productive cough for 4 days.  Patient states no relief over-the-counter cough preparations.  Patient rates the pain as a 7/10.  Patient described pain is "achy".  No palliative measure for complaint.         Past Medical History:  Diagnosis Date  . Abnormal Pap smear of cervix   . Anxiety   . Arthritis   . Barrett esophagus   . 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 Colon polyp    unc  . COPD (chronic obstructive pulmonary disease) ([place])   . DDD (degenerative disc disease), lumbar   . GERD (gastroesophageal reflux disease)   . Hepatitis C   . Hepatitis C   . Hypertension   . Hypothyroidism   . Neuropathy   . PTSD (post-traumatic stress disorder)   . Scoliosis   . Thyroid disease   . Torn ACL (anterior cruciate ligament)    Left Knee    Patient Active Problem List   Diagnosis Date Noted  . Epigastric pain 06/12/2018  . Chest pain 06/12/2018  . Elevated glucose 03/13/2018  . Leukocytosis 03/13/2018  . Neuropathy 02/06/2018  . Hypertriglyceridemia 01/09/2018  . Essential hypertension, benign 01/09/2018  . Vaginal atrophy 08/29/2017  . Dyspareunia in female 08/29/2017  . Hypothyroidism 07/23/2017  . COPD with acute exacerbation ([place]) 07/13/2017  . 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 Dysplasia of cervix 05/02/2016  . 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 Tobacco user 04/17/2016  . Vulvar lesion 04/17/2016  . Scoliosis 03/21/2016  . History of underactive thyroid 03/21/2016  .  Dysphagia 03/21/2016    Past Surgical History:  Procedure Laterality Date  . CERVICAL BIOPSY  W/ LOOP ELECTRODE EXCISION    . CERVICAL CONE BIOPSY    . CERVIX LESION DESTRUCTION    . DILATION AND CURETTAGE OF UTERUS     x 2  . LEEP N/A 06/04/2016   Procedure: LOOP ELECTROSURGICAL EXCISION PROCEDURE (LEEP);  Surgeon: Brayton Mars, MD;  Location: [facility] ORS;  Service: Gynecology;  Laterality: N/A;  . NECK SURGERY     Cervical Fusion  . THYROIDECTOMY, PARTIAL Bilateral   . TONSILLECTOMY     as a child  . TUBAL LIGATION      Prior to Admission medications   Medication Sig Start Date End Date Taking? Authorizing Provider  acetaminophen (TYLENOL) 500 MG tablet Take 500 mg by mouth every 6 (six) hours as needed for mild pain.     [provider]  atorvastatin (LIPITOR) 40 MG tablet Take 1 tablet (40 mg total) by mouth daily. 02/06/18   Doles-Johnson, Teah, NP  benzonatate (TESSALON PERLES) 100 MG capsule Take 2 capsules (200 mg total) by mouth 3 (three) times daily as needed. 09/08/18 09/08/19  Sable Feil, PA-C  busPIRone (BUSPAR) 5 MG tablet Take 1 tablet (5 mg total) by mouth 2 (two) times daily. 08/28/18   Doles-Johnson, Teah, NP  cetirizine (ZYRTEC) 10 MG tablet Take 1 tablet (10 mg total) by mouth daily. 03/26/17   Nori Riis, PA-C  chlorpheniramine-HYDROcodone (TUSSIONEX PENNKINETIC ER) 10-8 MG/5ML SUER Take 5 mLs by mouth at bedtime as needed for cough. 09/08/18   Sable Feil, PA-C  conjugated estrogens (PREMARIN) vaginal cream 1-2 gram intravaginal 2 times a week 08/29/17   Defrancesco, Alanda Slim, MD  fexofenadine-pseudoephedrine (ALLEGRA-D) 60-120 MG 12 hr tablet Take 1 tablet by mouth 2 (two) times daily. 09/08/18   Sable Feil, PA-C  gabapentin (NEURONTIN) 600 MG tablet Take 1 tablet (600 mg total) by mouth 3 (three) times daily. 07/03/18   Doles-Johnson, Teah, NP  levothyroxine (SYNTHROID, LEVOTHROID) 100 MCG tablet TAKE ONE TABLET BY MOUTH EVERY DAY BEFORE  BREAKFAST 07/01/18   Tawni Millers, MD  lisinopril (PRINIVIL,ZESTRIL) 40 MG tablet Take 1 tablet (40 mg total) by mouth daily. 08/28/18   Doles-Johnson, Teah, NP  ondansetron (ZOFRAN) 4 MG tablet Take 1 tablet (4 mg total) by mouth every 8 (eight) hours as needed for nausea or vomiting. 06/12/18   Doles-Johnson, Teah, NP  pantoprazole (PROTONIX) 40 MG tablet Take 1 tablet (40 mg total) by mouth 2 (two) times daily for 14 days. 08/07/18 08/21/18  Doles-Johnson, Teah, NP  PROAIR HFA 108 (90 Base) MCG/ACT inhaler INHALE 2 PUFFS INTO THE LUNGS EVERY 6 HOURS AS NEEDED FOR WHEEZING ORSHORTNESS OF BREATH 12/10/17   McGowan, Larene Beach A, PA-C  PROAIR HFA 108 (90 Base) MCG/ACT inhaler INHALE 2 PUFFS INTO THE LUNGS EVERY 6 HOURS AS NEEDED FOR WHEEZING ORSHORTNESS OF BREATH 04/30/18   Tawni Millers, MD  tiotropium (SPIRIVA) 18 MCG inhalation capsule Place 1 capsule (18 mcg total) into inhaler and inhale daily. 09/02/18   Doles-Johnson, Teah, NP  valACYclovir (VALTREX) 1000 MG tablet Take 1 tablet (1,000 mg total) by mouth daily as needed. FLARE UPS 03/26/17   Zara Council A, PA-C    Allergies Bee pollen; Gabapentin; Nsaids; and Pollen extract  Family History  Problem Relation Age of Onset  . Cancer Mother   . Diabetes Mother   . COPD Mother   . Breast cancer Mother 14  . COPD Father   . Stroke Father   . Polycystic ovary syndrome Daughter   . Birth defects Maternal Grandmother   . Colon cancer Maternal Grandmother   . Breast cancer Maternal Grandmother   . Birth defects Paternal Grandmother   . Breast cancer Paternal Grandmother   . Ovarian cancer Neg Hx   . Heart disease Neg Hx     Social History Social History   Tobacco Use  . Smoking status: Current Every Day Smoker    Packs/day: 0.25    Years: 30.00    Pack years: 7.50    Types: Cigarettes  . Smokeless tobacco: Never Used  Substance Use Topics  . Alcohol use: No  . Drug use: Yes    Types: Marijuana    Comment: 2 x a week    Review  of Systems  Constitutional: No fever/chills Eyes: No visual changes. ENT: No sore throat. Cardiovascular: Denies chest pain. Respiratory: Denies shortness of breath. Gastrointestinal: No abdominal pain.  No nausea, no vomiting.  No diarrhea.  No constipation. Genitourinary: Negative for dysuria. Musculoskeletal: Negative for back pain. Skin: Negative for rash. Neurological: Negative for headaches, focal weakness or numbness.   ____________________________________________   PHYSICAL EXAM:  VITAL SIGNS: ED Triage Vitals  Enc Vitals Group     BP 09/08/18 1239 (!) 169/92     Pulse Rate 09/08/18 1239 75     Resp 09/08/18 1239 18     Temp 09/08/18  1239 97.7 F (36.5 C)     Temp Source 09/08/18 1239 Oral     SpO2 09/08/18 1239 98 %     Weight 09/08/18 1241 188 lb (85.3 kg)     Height 09/08/18 1241 5\' 5"  (1.651 m)     Head Circumference --      Peak Flow --      Pain Score 09/08/18 1240 7     Pain Loc --      Pain Edu? --      Excl. in [place]? --     Constitutional: Alert and oriented. Well appearing and in no acute distress. Nose: Edematous nasal turbinates clear rhinorrhea. Mouth/Throat: Mucous membranes are moist.  Oropharynx non-erythematous.  Postnasal drainage. Neck: No stridor.   Hematological/Lymphatic/Immunilogical: No cervical lymphadenopathy. Cardiovascular: Normal rate, regular rhythm. Grossly normal heart sounds.  Good peripheral circulation.  Limited blood pressure. Respiratory: Normal respiratory effort.  No retractions. Lungs CTAB. Gastrointestinal: Soft and nontender. No distention. No abdominal bruits. No CVA tenderness. Skin:  Skin is warm, dry and intact. No rash noted. Psychiatric: Mood and affect are normal. Speech and behavior are normal.  ____________________________________________   LABS (all labs ordered are listed, but only abnormal results are displayed)  Labs Reviewed  INFLUENZA PANEL BY PCR (TYPE A & B)    ____________________________________________  EKG   ____________________________________________  RADIOLOGY  ED MD interpretation:    Official radiology report(s): Dg Chest 2 View  Result Date: 09/08/2018 CLINICAL DATA:  Cough for 2 weeks with sore throat. EXAM: CHEST - 2 VIEW COMPARISON:  Radiographs 07/13/2017 and 03/03/2017. FINDINGS: The heart size and mediastinal contours are normal. The lungs are clear. There is no pleural effusion or pneumothorax. No acute osseous findings are identified. IMPRESSION: Stable chest.  No acute cardiopulmonary process. Electronically Signed   By: Richardean Sale M.D.   On: 09/08/2018 14:02    ____________________________________________   PROCEDURES  Procedure(s) performed (including Critical Care):  Procedures   ____________________________________________   INITIAL IMPRESSION / ASSESSMENT AND PLAN / ED COURSE  As part of my medical decision making, I reviewed the following data within the [place]     Patient presents with body aches, chills, cough, and rib pain for 4 days.  Patient tested negative for influenza.  Patient chest x-ray was unremarkable.  Patient physical exam is consistent with viral respiratory infection.  Patient given discharge care instructions and advised take medication directed.  Patient advised call the [facility] if condition persist.          ____________________________________________   FINAL CLINICAL IMPRESSION(S) / ED DIAGNOSES  Final diagnoses:  Viral upper respiratory tract infection     ED Discharge Orders         Ordered    chlorpheniramine-HYDROcodone (TUSSIONEX PENNKINETIC ER) 10-8 MG/5ML SUER  At bedtime PRN     09/08/18 1429    benzonatate (TESSALON PERLES) 100 MG capsule  3 times daily PRN     09/08/18 1429    fexofenadine-pseudoephedrine (ALLEGRA-D) 60-120 MG 12 hr tablet  2 times daily     09/08/18 1429           Note:  This  document was prepared using Dragon voice recognition software and may include unintentional dictation errors.    Sable Feil, PA-C 09/08/18 [place], [place], MD 09/09/18 1524

## 2018-09-08 NOTE — ED Triage Notes (Signed)
Pt c/o body aches, chills, cough with rib pain, sore throat and thick phlegm X4 days. Tried allergy and dayquil OTC at home with no relief

## 2018-09-09 ENCOUNTER — Ambulatory Visit: Payer: PRIVATE HEALTH INSURANCE | Admitting: Licensed Clinical Social Worker

## 2018-09-09 DIAGNOSIS — F333 Major depressive disorder, recurrent, severe with psychotic symptoms: Secondary | ICD-10-CM

## 2018-09-09 DIAGNOSIS — F4001 Agoraphobia with panic disorder: Secondary | ICD-10-CM

## 2018-09-09 DIAGNOSIS — F4310 Post-traumatic stress disorder, unspecified: Secondary | ICD-10-CM

## 2018-09-09 NOTE — BH Specialist Note (Addendum)
Integrated Behavioral Health Follow Up Visit  MRN: [id number] Name: Brittney Chavez  **Patient requested a phone visit due to not feeling well. Explained the risks and benefits of telephone consultation via phone and client attested to verbal consent.   Type of Service: [place] Interpretor:No. Interpretor Name and Language: not applicable  SUBJECTIVE: Brittney Chavez is a 58 y.o. female accompanied by herself. Patient was referred by Staci Acosta NP at [facility] for mental health. Patient reports the following symptoms/concerns: She reports that she was hoping that she would notice a difference in her anxiety symptoms she started the Buspar in the last two weeks but hasn't. She explains that she has not been feeling well, cough, runny nose, and sore throat. She reports that she is on antibiotics but is frustrated. She describes having mood swings and paranoia. She explains that she is paranoid about people, about their motives, and is hesitant to trust others. She asked if the medication would cause her to feel more thirsty and shake when she becomes agitated. She reports that she has been experiencing less fogginess and outer body experiences since she is not taking as much Gabapentin as before. She notes that she is trying to stay busy at home and not put too much on herself at the moment. She denies suicidal and homicidal thoughts.  Duration of problem:  Severity of problem: moderate  OBJECTIVE: Mood: Anxious and Affect: Appropriate Risk of harm to self or others: No plan to harm self or others  LIFE CONTEXT: Family and Social: Brittney Chavez reports that her daughter is worried and concerned about her. School/Work: Brittney Chavez has applied for disability. Self-Care: See above. Life Changes: See above.  GOALS ADDRESSED: Patient will: 1.  Reduce symptoms of: anxiety  2.  Increase knowledge and/or ability of: coping skills  3.   Demonstrate ability to: Increase healthy adjustment to current life circumstances  INTERVENTIONS: Interventions utilized:  Brief CBT was utilized by the clinician focusing on her anxiety and affect on normal cognition. Clinician processed with the patient regarding how she has been doing since the last follow up session. Clinician explained that regardless of whether or not she can physically come into the clinic that she will make sure she can still have therapy sessions. Clinician explained that the clinic is trying to work out an option for tele health visits through my chart but are awaiting word from [facility] about the process moving forward. Clinician explained that it could take anywhere from 4 to 6 weeks for the Buspar to fully get into her system and start working. Clinician explained that she will have a case consultation with Dr. Octavia Heir, MD, Psychiatric consultant tomorrow regarding her symptoms and discuss increase. Clinician explained that it seems like she is more on edge that she was a week ago and it could be the changes in her mood are due to what's happening with the corona virus and being sick with flu like symptoms. Clinician encouraged the patient to continue to utilize her coping skills and practice self care.  Standardized Assessments completed: next session.  ASSESSMENT: Patient currently experiencing symptoms of anxiety due to situational stressors.   Patient may benefit from continuing with therapy.  PLAN: 1. Follow up with behavioral health clinician on : one week or earlier if needed via phone. 2. Behavioral recommendations: Case consultation with Dr. Octavia Heir, MD, Psychiatric consultant on Wednesday March 18th @ 9 am. Dr. Octavia Heir recommended discontinuing Buspar 5 mg twice daily  and prescribing the patient Zoloft 50 mg daily for her symptoms of anxiety and depression. Patient was contacted and is agreeable to the plans.  3. Referral(s): [place] (In  Clinic) 4. "From scale of 1-10, how likely are you to follow plan?": [place], LCSW

## 2018-09-10 ENCOUNTER — Telehealth: Payer: Self-pay | Admitting: Licensed Clinical Social Worker

## 2018-09-10 ENCOUNTER — Other Ambulatory Visit: Payer: Self-pay

## 2018-09-10 DIAGNOSIS — F4001 Agoraphobia with panic disorder: Secondary | ICD-10-CM

## 2018-09-10 DIAGNOSIS — F4310 Post-traumatic stress disorder, unspecified: Secondary | ICD-10-CM

## 2018-09-10 DIAGNOSIS — F333 Major depressive disorder, recurrent, severe with psychotic symptoms: Secondary | ICD-10-CM

## 2018-09-10 MED ORDER — SERTRALINE HCL 50 MG PO TABS: 50.0000 mg | ORAL_TABLET | Freq: Every day | ORAL | 1 refills | Status: DC

## 2018-09-10 NOTE — BH Specialist Note (Signed)
Integrated Behavioral Health Medication Management Phone Note  MRN: [id number] NAME: Brittney Chavez  Case consultation with Dr. Octavia Heir, [place], Psychiatric Consultant recommended discontinuing Brittney Chavez off of Buspar 5 mg twice daily and starting her on Zoloft 50 mg daily to treat her mood and anxiety symptoms. Patient was contacted and was agreeable to the plans.  Current Medications:  Outpatient Medications Prior to Visit  Medication Sig Dispense Refill  . acetaminophen (TYLENOL) 500 MG tablet Take 500 mg by mouth every 6 (six) hours as needed for mild pain.     Marland Kitchen atorvastatin (LIPITOR) 40 MG tablet Take 1 tablet (40 mg total) by mouth daily. 30 tablet 3  . benzonatate (TESSALON PERLES) 100 MG capsule Take 2 capsules (200 mg total) by mouth 3 (three) times daily as needed. 30 capsule 0  . busPIRone (BUSPAR) 5 MG tablet Take 1 tablet (5 mg total) by mouth 2 (two) times daily. 60 tablet 1  . cetirizine (ZYRTEC) 10 MG tablet Take 1 tablet (10 mg total) by mouth daily. 90 tablet o  . chlorpheniramine-HYDROcodone (TUSSIONEX PENNKINETIC ER) 10-8 MG/5ML SUER Take 5 mLs by mouth at bedtime as needed for cough. 70 mL 0  . conjugated estrogens (PREMARIN) vaginal cream 1-2 gram intravaginal 2 times a week 60 g 1  . fexofenadine-pseudoephedrine (ALLEGRA-D) 60-120 MG 12 hr tablet Take 1 tablet by mouth 2 (two) times daily. 20 tablet 0  . gabapentin (NEURONTIN) 600 MG tablet Take 1 tablet (600 mg total) by mouth 3 (three) times daily. 90 tablet 2  . levothyroxine (SYNTHROID, LEVOTHROID) 100 MCG tablet TAKE ONE TABLET BY MOUTH EVERY DAY BEFORE BREAKFAST 90 tablet 0  . lisinopril (PRINIVIL,ZESTRIL) 40 MG tablet Take 1 tablet (40 mg total) by mouth daily. 30 tablet 3  . ondansetron (ZOFRAN) 4 MG tablet Take 1 tablet (4 mg total) by mouth every 8 (eight) hours as needed for nausea or vomiting. 20 tablet 0  . pantoprazole (PROTONIX) 40 MG tablet Take 1 tablet (40 mg total) by mouth 2 (two) times daily for 14 days.  28 tablet 0  . PROAIR HFA 108 (90 Base) MCG/ACT inhaler INHALE 2 PUFFS INTO THE LUNGS EVERY 6 HOURS AS NEEDED FOR WHEEZING ORSHORTNESS OF BREATH 25.5 g 0  . PROAIR HFA 108 (90 Base) MCG/ACT inhaler INHALE 2 PUFFS INTO THE LUNGS EVERY 6 HOURS AS NEEDED FOR WHEEZING ORSHORTNESS OF BREATH 25.5 g 0  . tiotropium (SPIRIVA) 18 MCG inhalation capsule Place 1 capsule (18 mcg total) into inhaler and inhale daily. 30 capsule 12  . valACYclovir (VALTREX) 1000 MG tablet Take 1 tablet (1,000 mg total) by mouth daily as needed. FLARE UPS 30 tablet 0   No facility-administered medications prior to visit.     Patient has been able to get all medications filled as prescribed: Yes  Patient is currently taking all medications as prescribed: Yes  Patient reports experiencing side effects: No  Patient describes feeling this way on medications: Patient complaint of after being on Buspar for two in a half weeks that she hasn't noticed much of a difference in her anxiety symptoms. She describes having mood swings and paranoia.   Additional patient concerns:  Patient advised to schedule appointment with provider for evaluation of medication side effects or additional concerns: No   Bayard Hugger, LCSW

## 2018-09-11 ENCOUNTER — Other Ambulatory Visit: Payer: Self-pay | Admitting: Adult Health Nurse Practitioner

## 2018-09-12 ENCOUNTER — Telehealth: Payer: Self-pay | Admitting: Pharmacist

## 2018-09-12 NOTE — Telephone Encounter (Signed)
09/12/2018 11:27:26 AM - Spiriva 15mcg handihaler pending  09/12/2018 I have received the provider portion of [employer] application, holding for patient to return her portion mailed to her 09/03/2018.Brittney Chavez

## 2018-09-18 ENCOUNTER — Other Ambulatory Visit: Payer: Self-pay

## 2018-09-18 ENCOUNTER — Ambulatory Visit: Payer: PRIVATE HEALTH INSURANCE | Admitting: Licensed Clinical Social Worker

## 2018-09-18 DIAGNOSIS — F333 Major depressive disorder, recurrent, severe with psychotic symptoms: Secondary | ICD-10-CM

## 2018-09-18 DIAGNOSIS — F4001 Agoraphobia with panic disorder: Secondary | ICD-10-CM

## 2018-09-18 DIAGNOSIS — F4310 Post-traumatic stress disorder, unspecified: Secondary | ICD-10-CM

## 2018-09-18 NOTE — BH Specialist Note (Signed)
Integrated Behavioral Health Follow Up Visit  MRN: [id number] Name: Brittney Chavez  Type of Service: [place] Interpretor:No. Interpretor Name and Language: Not applicable.  SUBJECTIVE: Brittney Chavez is a 58 y.o. female accompanied by herself. Patient was referred by Staci Acosta NP for mental health. Patient reports the following symptoms/concerns: She reports that she has been experiencing a lot of anxiety since the last follow up session. She describes having a worsening cough, having to push her oxygen up due to congestion, and having problems with shortness of breath. She notes that she hasn't been leaving the house and her family is bringing her what she needs. She reports that picked up the Zoloft today and will start it tonight. She denies suicidal and homicidal thoughts.  Duration of problem: ; Severity of problem: moderate  OBJECTIVE: Mood: Euthymic and Affect: Appropriate Risk of harm to self or others: No plan to harm self or others  LIFE CONTEXT: Family and Social: See above School/Work: See above. Self-Care: See above. Life Changes: See above.  GOALS ADDRESSED: Patient will: 1.  Reduce symptoms of: anxiety  2.  Increase knowledge and/or ability of: coping skills  3.  Demonstrate ability to: Increase healthy adjustment to current life circumstances  INTERVENTIONS: Interventions utilized:  Brief CBT was utilized by the clinician focusing on anxiety and affect on normal cognition. Clinician processed with the patient regarding how she has been doing since the last follow up session. Clinician suggested that the patient request a my chart visit to see a provider and potentially get some kind of antibiotic. Clinician explained that the Zoloft could take anywhere from 4 to 6 weeks to fully get in her system. Clinician explained that she is glad to hear that she is taking precautions in regards to staying at home and social  distancing. Clinician encouraged the patient to continue to utilize her coping skills and follow up in one week.  Standardized Assessments completed: next session  ASSESSMENT: Patient currently experiencing symptoms of anxiety due to her physical health and recent coronavirus news coverage.   Patient may benefit from continuing with therapy and utilizing her coping skills.  PLAN: 1. Follow up with behavioral health clinician on : one week or earlier if needed. 2. Behavioral recommendations: See above 3. Referral(s): [place] (In Clinic) 4. "From scale of 1-10, how likely are you to follow plan?": [place], LCSW

## 2018-09-25 ENCOUNTER — Other Ambulatory Visit: Payer: Self-pay

## 2018-09-25 ENCOUNTER — Ambulatory Visit: Payer: PRIVATE HEALTH INSURANCE | Admitting: Licensed Clinical Social Worker

## 2018-09-25 ENCOUNTER — Ambulatory Visit: Payer: PRIVATE HEALTH INSURANCE | Admitting: Urology

## 2018-09-25 DIAGNOSIS — K5909 Other constipation: Secondary | ICD-10-CM

## 2018-09-25 DIAGNOSIS — F4001 Agoraphobia with panic disorder: Secondary | ICD-10-CM

## 2018-09-25 DIAGNOSIS — F333 Major depressive disorder, recurrent, severe with psychotic symptoms: Secondary | ICD-10-CM

## 2018-09-25 DIAGNOSIS — J069 Acute upper respiratory infection, unspecified: Secondary | ICD-10-CM

## 2018-09-25 DIAGNOSIS — M25521 Pain in right elbow: Secondary | ICD-10-CM

## 2018-09-25 DIAGNOSIS — F4310 Post-traumatic stress disorder, unspecified: Secondary | ICD-10-CM

## 2018-09-25 MED ORDER — PANTOPRAZOLE SODIUM 40 MG PO TBEC: 40.0000 mg | DELAYED_RELEASE_TABLET | Freq: Two times a day (BID) | ORAL | 0 refills | Status: AC

## 2018-09-25 MED ORDER — AZITHROMYCIN 250 MG PO TABS: ORAL_TABLET | ORAL | 0 refills | Status: DC

## 2018-09-25 NOTE — Progress Notes (Signed)
Virtual Visit via Telephone Note  I connected with Brittney Chavez on 09/25/2018 at 1772 by telephone and verified that I am speaking with the correct person using two identifiers.  They are located at home.   I am located at my home.    This visit type was conducted due to national recommendations for restrictions regarding the COVID-19 Pandemic (e.g. social distancing).  This format is felt to be most appropriate for this patient at this time.  All issues noted in this document were discussed and addressed.  No physical exam was performed.   I discussed the limitations, risks, security and privacy concerns of performing an evaluation and management service by telephone and the availability of in person appointments. I also discussed with the patient that there may be a patient responsible charge related to this service. The patient expressed understanding and agreed to proceed.   History of Present Illness: Right elbow pain x weeks,  Ice, heat, ASA, Mobic ineffective in controlling pain.  Movement and lifting up objects causes the pain to worsen.  Mild swelling on the elbow.  No new numbness in right arm.  No fevers.  Fingers go to sleep when she puts the arm in certain position.    She states she is still having left ear pain and sinus drainage.     Observations/Objective: CLINICAL DATA:  Right elbow pain for 2 weeks. Arthritis. No known injury.  EXAM: RIGHT ELBOW - COMPLETE 3+ VIEW  COMPARISON:  None.  FINDINGS: The mineralization and alignment are normal. There is no evidence of acute fracture or dislocation. There is mild spurring of both humeral epicondyles and the coronoid process. Possible small loose body anteriorly on the lateral view. No significant joint effusion.  IMPRESSION: Mild degenerative changes with possible small intra-articular loose body. No acute osseous findings.   Electronically Signed   By: Richardean Sale M.D.   On: 09/03/2018 15:43  Assessment  and Plan:  1. Right elbow pain ? Loose body in the elbow Will try a homemade sling and not use the right arm for a few days and will seek treatment in the ED  May need to see orthopedics in the future - need to check on [facility]   2. Constipation Will try stool softener - may be due to the Zoloft  3. URI Sent in script for Z-pak  Will need to see ENT at some point - need to check on [facility]   Follow Up Instructions:  Will need a follow up appointment with Dr. Vickki Hearing for right arm pain  I discussed the assessment and treatment plan with the patient. The patient was provided an opportunity to ask questions and all were answered. The patient agreed with the plan and demonstrated an understanding of the instructions.   The patient was advised to call back or seek an in-person evaluation if the symptoms worsen or if the condition fails to improve as anticipated.  I provided 10 minutes of non-face-to-face time during this encounter.   Jadalynn Burr, PA-C

## 2018-09-25 NOTE — BH Specialist Note (Signed)
Integrated Behavioral Health Follow Up Phone Visit  MRN: [id number] Name: Brittney Chavez   Type of Service: [place] Interpretor:No. Interpretor Name and Language: Not applicable.  SUBJECTIVE: Brittney Chavez is a 58 y.o. female accompanied by herself. Patient was referred by Staci Acosta NP for mental health . Patient reports the following symptoms/concerns: She reports that she has been doing better on the medication. She describes feeling less anxious and handling incidents that arise a lot better since she started the Sertraline. She complains of feeling sleeping and kind of in a daze during the day. She reports that she has started working on a garden in her back yard. She explains that she actually felt like taking a shower today, which was good. She reports that she has been utilizing the relaxation and grounding techniques when she is feeling anxious or stressed. She notes that her sleep is not the best due to the pain in her arm. She denies suicidal and homicidal thoughts.  Duration of problem: ; Severity of problem: moderate  OBJECTIVE: Mood: Euthymic and Affect: Appropriate Risk of harm to self or others: No plan to harm self or others  LIFE CONTEXT: Family and Social:  School/Work:  Self-Care:  Life Changes:   GOALS ADDRESSED: Patient will: 1.  Reduce symptoms of: anxiety  2.  Increase knowledge and/or ability of: coping skills, healthy habits and self-management skills  3.  Demonstrate ability to: Increase healthy adjustment to current life circumstances  INTERVENTIONS: Interventions utilized:  Brief CBT was utilized by the clinician focusing on anxiety and affect on normal cognition. Clinician processed with the patient regarding how she has been doing since the last follow up session. Clinician asked the patient how she has been doing on Sertraline 50 mg at bedtime for anxiety and depression. Clinician explained that she will  discuss the side effects that she is experiencing with Dr. Octavia Heir, MD, Psychiatric Consultant on Tuesday April 7th @ 9 am. Clinician explained to the patient that the being in a daze could be due to the amount of pain she is experiencing in her right arm and not getting adequate rest. Clinician encouraged the patient to continue to utilize her coping skills and practice self care.  Standardized Assessments completed: GAD-7 and PHQ 9  ASSESSMENT: Patient currently experiencing symptoms of anxiety due to pain in her arm and what's happening in the world right now.   Patient may benefit from continuing with therapy and coping skills.  PLAN: 1. Follow up with behavioral health clinician on : one week or earlier if needed. 2. Behavioral recommendations: See above. Case consultation with Dr. Octavia Heir, MD, Psychiatric consultant on Tuesday April 9th @ 9 am.  3. Referral(s): [employer] (In Clinic) 4. "From scale of 1-10, how likely are you to follow plan?": [place], LCSW

## 2018-09-29 ENCOUNTER — Telehealth: Payer: Self-pay | Admitting: Pharmacist

## 2018-09-29 NOTE — Telephone Encounter (Signed)
09/29/2018 9:23:38 AM - [facility] Refill  09/29/2018 I have received notice from [employer], time for refill, checked and faxed back for refill.Brittney Chavez

## 2018-10-02 ENCOUNTER — Other Ambulatory Visit: Payer: Self-pay

## 2018-10-02 ENCOUNTER — Ambulatory Visit: Payer: PRIVATE HEALTH INSURANCE | Admitting: Licensed Clinical Social Worker

## 2018-10-02 ENCOUNTER — Other Ambulatory Visit: Payer: Self-pay | Admitting: Urology

## 2018-10-02 DIAGNOSIS — F333 Major depressive disorder, recurrent, severe with psychotic symptoms: Secondary | ICD-10-CM

## 2018-10-02 DIAGNOSIS — F4001 Agoraphobia with panic disorder: Secondary | ICD-10-CM

## 2018-10-02 DIAGNOSIS — F4310 Post-traumatic stress disorder, unspecified: Secondary | ICD-10-CM

## 2018-10-02 MED ORDER — ALBUTEROL SULFATE HFA 108 (90 BASE) MCG/ACT IN AERS: 2.0000 | INHALATION_SPRAY | Freq: Four times a day (QID) | RESPIRATORY_TRACT | 6 refills | Status: AC | PRN
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
## 2018-10-03 ENCOUNTER — Telehealth: Payer: Self-pay | Admitting: Pharmacist

## 2018-10-03 NOTE — Telephone Encounter (Signed)
10/03/2018 11:38:31 AM - Premarin Vaginal Cream renewal  10/03/2018 Premarin Vaginal Cream script was previously sent to provider 07/02/2018 to sign & return, never received back. Patient enrollment with [employer] terminated 09/26/2018, I have printed new application-mailing patient her portion to sign & return with poi/support & taxes/[id number], also mailing Dr. Enzo Bi his portion to sign and send back to [place].Brittney Chavez

## 2018-10-09 ENCOUNTER — Ambulatory Visit (INDEPENDENT_AMBULATORY_CARE_PROVIDER_SITE_OTHER): Payer: Self-pay | Admitting: Obstetrics and Gynecology

## 2018-10-09 ENCOUNTER — Ambulatory Visit: Payer: PRIVATE HEALTH INSURANCE | Admitting: Licensed Clinical Social Worker

## 2018-10-09 ENCOUNTER — Encounter: Payer: Self-pay | Admitting: Obstetrics and Gynecology

## 2018-10-09 ENCOUNTER — Other Ambulatory Visit: Payer: Self-pay

## 2018-10-09 VITALS — Ht 65.0 in | Wt 188.0 lb

## 2018-10-09 DIAGNOSIS — N941 Unspecified dyspareunia: Secondary | ICD-10-CM

## 2018-10-09 DIAGNOSIS — F4001 Agoraphobia with panic disorder: Secondary | ICD-10-CM

## 2018-10-09 DIAGNOSIS — F333 Major depressive disorder, recurrent, severe with psychotic symptoms: Secondary | ICD-10-CM

## 2018-10-09 DIAGNOSIS — N952 Postmenopausal atrophic vaginitis: Secondary | ICD-10-CM

## 2018-10-09 DIAGNOSIS — F4310 Post-traumatic stress disorder, unspecified: Secondary | ICD-10-CM

## 2018-10-09 MED ORDER — ESTROGENS, CONJUGATED 0.625 MG/GM VA CREA: TOPICAL_CREAM | VAGINAL | 1 refills | Status: AC

## 2018-10-09 NOTE — BH Specialist Note (Signed)
  Integrated Behavioral Health Follow Up Phone Visit  MRN: [id number] Name: Brittney Chavez  Type of Service: [place] Interpretor:No. Interpretor Name and Language: Not applicable.  SUBJECTIVE: Brittney Chavez is a 58 y.o. female accompanied by herself. Patient was referred by Staci Acosta NP for mental health. Patient reports the following symptoms/concerns: She reports that she is fine with taking the Zyprexa but is concerned about weight gain. She describes having of a roller coaster of emotions in the last week. She reports that she had a hard time not being able to see her mom for Easter and spend time with her. She reports that her appetite has increased and is eating for comfort. She notes that she is leaning on her daughter for support. She denies suicidal and homicidal thoughts.  Duration of problem: ; Severity of problem: moderate  OBJECTIVE: Mood: Euthymic and Affect: Unable to assess due to phone visit. Risk of harm to self or others: No plan to harm self or others  LIFE CONTEXT: Family and Social: see above. School/Work: Ms. Gad has applied for disability. Self-Care: see above. Life Changes: see above.  GOALS ADDRESSED: Patient will: 1.  Reduce symptoms of: anxiety  2.  Increase knowledge and/or ability of: coping skills, healthy habits and self-management skills  3.  Demonstrate ability to: Increase healthy adjustment to current life circumstances  INTERVENTIONS: Interventions utilized:  Brief CBT was utilized by the clinician focusing on the patient's anxiety and affect on normal cognition. Clinician processed with the patient regarding how she has been doing since the last follow up session. Clinician provided psycho education in regards to the difference between Atypical and Non typical Antipsychotics. Clinician explained that antipsychotics can cause weight gain, and increase in cholesterol. Clinician explained that the  other kind of mood stabilizers have less metabolic side effects but are on the patient assistance program and require labs. Clinician explained that the key to trying to remain positive is to focus on the fact that the pandemic is temporary. Clinician encouraged the patient to continue to utilize her coping skills and lean on her support system. Standardized Assessments completed: GAD-7 and PHQ 9  ASSESSMENT: Patient currently experiencing see above..   Patient may benefit from see above.  PLAN: 1. Follow up with behavioral health clinician on : one week or earlier if needed. 2. Behavioral recommendations: see above. 3. Referral(s): [place] (In Clinic) 4. "From scale of 1-10, how likely are you to follow plan?": [place], LCSW

## 2018-10-09 NOTE — Progress Notes (Signed)
Virtual Visit via Telephone Note  I connected with Brittney Chavez on 10/09/18 at  2:30 PM EDT by telephone and verified that I am speaking with the correct person using two identifiers.   I discussed the limitations, risks, security and privacy concerns of performing an evaluation and management service by telephone and the availability of in person appointments. I also discussed with the patient that there may be a patient responsible charge related to this service. The patient expressed understanding and agreed to proceed.  Location of patient: Home  Patient gave explicit verbal consent for telephone visit:  YES  Location of provider:  [facility] office  Persons other than physician and patient involved in provider conference:  None  History of Present Illness:    Patient has been using Premarin vaginal cream for vaginal dryness and pain with intercourse.  She says the cream is working very well for her dryness but she continues to experience dyspareunia.  She and Dr. Keturah Barre discussed use of vaginal dilators but she states that she cannot afford them and has not tried the strategy.    Observations/Objective:      Assessment: Menopausal dyspareunia.  Dr. Enzo Bi thought it was because of a small introitus/vagina  Plan:   Have discussed use of vaginal dilators/candles/vaginal lubricants. Recommend continued use of Premarin vaginal cream as directed.   Follow Up Instructions:   Annual examination when COVID-19 permits    I discussed the assessment and treatment plan with the patient.  The patient agreed with the plan and demonstrated an understanding of the instructions.  She was given an opportunity to ask questions and all of her questions were answered.   The patient was advised to call back or seek an in-person evaluation if the symptoms worsen or if the condition fails to improve as anticipated.  I provided 17 minutes of non-face-to-face time during this encounter.   Jeannie Fend, MD

## 2018-10-14 ENCOUNTER — Other Ambulatory Visit: Payer: Self-pay

## 2018-10-14 MED ORDER — OLANZAPINE 10 MG PO TABS: 10.0000 mg | ORAL_TABLET | Freq: Every day | ORAL | 0 refills | Status: DC

## 2018-10-16 ENCOUNTER — Encounter: Payer: Self-pay | Admitting: Emergency Medicine

## 2018-10-16 ENCOUNTER — Other Ambulatory Visit: Payer: Self-pay

## 2018-10-16 ENCOUNTER — Ambulatory Visit: Payer: Self-pay | Admitting: Licensed Clinical Social Worker

## 2018-10-16 ENCOUNTER — Emergency Department
Admission: EM | Admit: 2018-10-16 | Discharge: 2018-10-16 | Disposition: A | Discharge status: Home / Self Care | Payer: Self-pay | Attending: Emergency Medicine | Admitting: Emergency Medicine

## 2018-10-16 DIAGNOSIS — E039 Hypothyroidism, unspecified: Secondary | ICD-10-CM | POA: Insufficient documentation

## 2018-10-16 DIAGNOSIS — F4001 Agoraphobia with panic disorder: Secondary | ICD-10-CM

## 2018-10-16 DIAGNOSIS — F17210 Nicotine dependence, cigarettes, uncomplicated: Secondary | ICD-10-CM | POA: Insufficient documentation

## 2018-10-16 DIAGNOSIS — J449 Chronic obstructive pulmonary disease, unspecified: Secondary | ICD-10-CM | POA: Insufficient documentation

## 2018-10-16 DIAGNOSIS — I10 Essential (primary) hypertension: Secondary | ICD-10-CM | POA: Insufficient documentation

## 2018-10-16 DIAGNOSIS — G5621 Lesion of ulnar nerve, right upper limb: Secondary | ICD-10-CM | POA: Insufficient documentation

## 2018-10-16 DIAGNOSIS — Z79899 Other long term (current) drug therapy: Secondary | ICD-10-CM | POA: Insufficient documentation

## 2018-10-16 DIAGNOSIS — F333 Major depressive disorder, recurrent, severe with psychotic symptoms: Secondary | ICD-10-CM

## 2018-10-16 DIAGNOSIS — F4310 Post-traumatic stress disorder, unspecified: Secondary | ICD-10-CM

## 2018-10-16 MED ORDER — PREDNISONE 10 MG (21) PO TBPK: ORAL_TABLET | ORAL | 0 refills | Status: DC

## 2018-10-16 MED ORDER — HYDROCODONE-ACETAMINOPHEN 5-325 MG PO TABS: 1.0000 | ORAL_TABLET | ORAL | 0 refills | Status: DC | PRN

## 2018-10-16 NOTE — ED Triage Notes (Addendum)
PT c/o RT arm pain x63month. States pain is worse in elbow, denies radiating pain. PT denies any acute injury. Pt in NAD

## 2018-10-16 NOTE — ED Notes (Signed)
See triage note  Presents with right arm pain  States pain started about 1 month ago  Pain is from elbow down into forearm  Denies any injury  States she has been using Mobic,Bacolfen and flexeril w/o relief   Good pulses  No deformity

## 2018-10-16 NOTE — BH Specialist Note (Signed)
Integrated Behavioral Health Follow Up Phone Visit  MRN: [id number] Name: Brittney Chavez  Type of Service: [place] Interpretor:No. Interpretor Name and Language: Not applicable.   SUBJECTIVE: Brittney Chavez is a 58 y.o. female accompanied by herself. Patient was referred by Brittney Acosta NP for mental health. Patient reports the following symptoms/concerns: She reports that she had to go to the ER this morning due to the pain in her shoulder. She explains that the doctor told her that she has a pinched nerve in her elbow. She notes that she was given a sling so she not going to pull a muscle. She asked if her medicines had been sent in to the pharmacy. She reports that her anxiety was up having to go to the Er but otherwise she is doing okay. She denies suicidal and homicidal thoughts.  Duration of problem: ; Severity of problem: moderate  OBJECTIVE: Mood: Euthymic and Affect: Appropriate Risk of harm to self or others: No plan to harm self or others  LIFE CONTEXT: Family and Social: Brittney Chavez has the support of her daughter, brother, and mom. School/Work: Brittney Chavez is in the process of applying for disability. Self-Care: see above. Life Changes: see above.   GOALS ADDRESSED: Patient will: 1.  Reduce symptoms of: anxiety and stress  2.  Increase knowledge and/or ability of: coping skills and stress reduction  3.  Demonstrate ability to: Increase healthy adjustment to current life circumstances  INTERVENTIONS: Interventions utilized:  Brief CBT was utilized by the clinician focusing on her anxiety and affect on normal cognition. Clinician processed with the patient regarding how she has been doing since the last follow up session. Clinician explained that it sounds like she exhausted all alternatives to managing her pain and that going to the ER was the best option at the time. Clinician explained to the patient that it makes sense why  her anxiety increase due to the ER visit. Clinician encouraged the patient to continue to utilize her coping skills and practice self care. Clinician verified that all her medications were sent to the pharmacy via confirmed receipts and suggested that she give Medication Management a call.  Standardized Assessments completed: next session.  ASSESSMENT: Patient currently experiencing see above.   Patient may benefit from see above .  PLAN: 1. Follow up with behavioral health clinician on : two weeks or earlier if needed. 2. Behavioral recommendations: see above. 3. Referral(s): [place] (In Clinic) 4. "From scale of 1-10, how likely are you to follow plan?": [place], LCSW

## 2018-10-16 NOTE — ED Provider Notes (Signed)
[facility] Emergency Department Provider Note ____________________________________________  Time seen: Approximately 3:33 PM  I have reviewed the triage vital signs and the nursing notes.   HISTORY  Chief Complaint No chief complaint on file.    HPI Brittney Chavez is a 58 y.o. female who presents to the emergency department for evaluation and treatment of right elbow pain that radiates into the right hand.  Patient states that pain started about 1 month ago and has progressively worsened.  She states that the pains wakes her up at night.  She has been using Mobic, baclofen, and Flexeril without relief.  She denies any injury.  She is left-hand dominant.  Past Medical History:  Diagnosis Date  . Abnormal Pap smear of cervix   . Anxiety   . Arthritis   . Barrett esophagus   . 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 Colon polyp    unc  . COPD (chronic obstructive pulmonary disease) ([place])   . DDD (degenerative disc disease), lumbar   . GERD (gastroesophageal reflux disease)   . Hepatitis C   . Hepatitis C   . Hypertension   . Hypothyroidism   . Neuropathy   . PTSD (post-traumatic stress disorder)   . Scoliosis   . Thyroid disease   . Torn ACL (anterior cruciate ligament)    Left Knee    Patient Active Problem List   Diagnosis Date Noted  . Epigastric pain 06/12/2018  . Chest pain 06/12/2018  . Elevated glucose 03/13/2018  . Leukocytosis 03/13/2018  . Neuropathy 02/06/2018  . Hypertriglyceridemia 01/09/2018  . Essential hypertension, benign 01/09/2018  . Vaginal atrophy 08/29/2017  . Dyspareunia in female 08/29/2017  . Hypothyroidism 07/23/2017  . COPD with acute exacerbation ([place]) 07/13/2017  . 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 Dysplasia of cervix 05/02/2016  . 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 Tobacco user 04/17/2016  . Vulvar lesion 04/17/2016  . Scoliosis 03/21/2016  . History of underactive  thyroid 03/21/2016  . Dysphagia 03/21/2016    Past Surgical History:  Procedure Laterality Date  . CERVICAL BIOPSY  W/ LOOP ELECTRODE EXCISION    . CERVICAL CONE BIOPSY    . CERVIX LESION DESTRUCTION    . DILATION AND CURETTAGE OF UTERUS     x 2  . LEEP N/A 06/04/2016   Procedure: LOOP ELECTROSURGICAL EXCISION PROCEDURE (LEEP);  Surgeon: Brayton Mars, MD;  Location: [facility] ORS;  Service: Gynecology;  Laterality: N/A;  . NECK SURGERY     Cervical Fusion  . THYROIDECTOMY, PARTIAL Bilateral   . TONSILLECTOMY     as a child  . TUBAL LIGATION      Prior to Admission medications   Medication Sig Start Date End Date Taking? Authorizing Provider  acetaminophen (TYLENOL) 500 MG tablet Take 500 mg by mouth every 6 (six) hours as needed for mild pain.     [provider]  albuterol (PROAIR HFA) 108 (90 Base) MCG/ACT inhaler Inhale 2 puffs into the lungs every 6 (six) hours as needed for wheezing or shortness of breath. 10/02/18   McGowan, Larene Beach A, PA-C  atorvastatin (LIPITOR) 40 MG tablet TAKE ONE TABLET BY MOUTH EVERY DAY 09/11/18   Doles-Johnson, Teah, NP  cetirizine (ZYRTEC) 10 MG tablet Take 1 tablet (10 mg total) by mouth daily. 03/26/17   Zara Council A, PA-C  conjugated estrogens (PREMARIN) vaginal cream 1-2 gram intravaginal 2 times a week 08/29/17   Defrancesco, Alanda Slim, MD  conjugated estrogens (PREMARIN) vaginal cream 1 gram intravaginal BIW 10/09/18   Harlin Heys, MD  fexofenadine-pseudoephedrine (ALLEGRA-D) 60-120 MG 12 hr tablet Take 1 tablet by mouth 2 (two) times daily. 09/08/18   Sable Feil, PA-C  gabapentin (NEURONTIN) 600 MG tablet Take 1 tablet (600 mg total) by mouth 3 (three) times daily. 07/03/18   Doles-Johnson, Teah, NP  HYDROcodone-acetaminophen (NORCO/VICODIN) 5-325 MG tablet Take 1 tablet by mouth every 4 (four) hours as needed for moderate pain. 10/16/18 10/16/19  Jacquise Rarick, Johnette Abraham B, FNP  levothyroxine (SYNTHROID, LEVOTHROID) 100 MCG tablet TAKE  ONE TABLET BY MOUTH EVERY DAY BEFORE BREAKFAST 07/01/18   Tawni Millers, MD  lisinopril (PRINIVIL,ZESTRIL) 40 MG tablet Take 1 tablet (40 mg total) by mouth daily. 08/28/18   Doles-Johnson, Teah, NP  OLANZapine (ZYPREXA) 10 MG tablet Take 1 tablet (10 mg total) by mouth at bedtime for 30 days. 10/14/18 11/13/18  Doles-Johnson, Teah, NP  ondansetron (ZOFRAN) 4 MG tablet Take 1 tablet (4 mg total) by mouth every 8 (eight) hours as needed for nausea or vomiting. 06/12/18   Doles-Johnson, Teah, NP  pantoprazole (PROTONIX) 40 MG tablet Take 1 tablet (40 mg total) by mouth 2 (two) times daily for 14 days. 09/25/18 10/09/18  Zara Council A, PA-C  predniSONE (STERAPRED UNI-PAK 21 TAB) 10 MG (21) TBPK tablet Take 6 tablets on the first day and decrease by 1 tablet each day until finished. 10/16/18   Niyam Bisping, Dessa Phi, FNP  PROAIR HFA 108 (90 Base) MCG/ACT inhaler INHALE 2 PUFFS INTO THE LUNGS EVERY 6 HOURS AS NEEDED FOR WHEEZING ORSHORTNESS OF BREATH 04/30/18   Tawni Millers, MD  tiotropium (SPIRIVA) 18 MCG inhalation capsule Place 1 capsule (18 mcg total) into inhaler and inhale daily. 09/02/18   Doles-Johnson, Teah, NP  valACYclovir (VALTREX) 1000 MG tablet Take 1 tablet (1,000 mg total) by mouth daily as needed. FLARE UPS 03/26/17   Zara Council A, PA-C    Allergies Bee pollen; Gabapentin; Nsaids; and Pollen extract  Family History  Problem Relation Age of Onset  . Cancer Mother   . Diabetes Mother   . COPD Mother   . Breast cancer Mother 80  . COPD Father   . Stroke Father   . Polycystic ovary syndrome Daughter   . Birth defects Maternal Grandmother   . Colon cancer Maternal Grandmother   . Breast cancer Maternal Grandmother   . Birth defects Paternal Grandmother   . Breast cancer Paternal Grandmother   . Ovarian cancer Neg Hx   . Heart disease Neg Hx     Social History Social History   Tobacco Use  . Smoking status: Current Every Day Smoker    Packs/day: 0.25    Years: 30.00    Pack  years: 7.50    Types: Cigarettes  . Smokeless tobacco: Never Used  Substance Use Topics  . Alcohol use: No  . Drug use: Yes    Types: Marijuana    Comment: 2 x a week    Review of Systems Constitutional: Negative for fever. Cardiovascular: Negative for chest pain. Respiratory: Negative for shortness of breath. Musculoskeletal: Positive for right arm pain. Skin: Negative for open wounds or lesions Neurological: Negative for decrease in sensation  ____________________________________________   PHYSICAL EXAM:  VITAL SIGNS: ED Triage Vitals [10/16/18 0923]  Enc Vitals Group     BP (!) 180/100     Pulse Rate 73     Resp 16     Temp (!) 97.5  F (36.4 C)     Temp Source Oral     SpO2 99 %     Weight      Height      Head Circumference      Peak Flow      Pain Score 9     Pain Loc      Pain Edu?      Excl. in [place]?     Constitutional: Alert and oriented. Well appearing and in no acute distress. Eyes: Conjunctivae are clear without discharge or drainage Head: Atraumatic Neck: Supple.  No focal midline tenderness. Respiratory: No cough. Respirations are even and unlabored. Musculoskeletal: Pain in the ulnar groove of the right arm with radicular pain into the right pinky and ring fingers.  Neurologic: Radicular pain in the right forearm to hand.  Skin: Negative for open wounds or lesion.  Psychiatric: Affect and behavior are appropriate.  ____________________________________________   LABS (all labs ordered are listed, but only abnormal results are displayed)  Labs Reviewed - No data to display ____________________________________________  RADIOLOGY  Not indicated. ____________________________________________   PROCEDURES  Procedures  ____________________________________________   INITIAL IMPRESSION / ASSESSMENT AND PLAN / ED COURSE  Brittney Chavez is a 58 y.o. who presents to the emergency department for treatment and evaluation of right  elbow/arm/hand pain. She will be treated with prednisone and Norco.   Patient instructed to follow-up with orthopedics or primary care.  She was also instructed to return to the emergency department for symptoms that change or worsen if unable schedule an appointment with orthopedics or primary care.  Medications - No data to display  Pertinent labs & imaging results that were available during my care of the patient were reviewed by me and considered in my medical decision making (see chart for details).  _________________________________________   FINAL CLINICAL IMPRESSION(S) / ED DIAGNOSES  Final diagnoses:  Ulnar nerve compression, right    ED Discharge Orders         Ordered    predniSONE (STERAPRED UNI-PAK 21 TAB) 10 MG (21) TBPK tablet     10/16/18 1001    HYDROcodone-acetaminophen (NORCO/VICODIN) 5-325 MG tablet  Every 4 hours PRN     10/16/18 1001           If controlled substance prescribed during this visit, 12 month history viewed on the [place] prior to issuing an initial prescription for Schedule II or III opiod.   Victorino Dike, FNP 10/16/18 1541    Schuyler Amor, MD 10/17/18 [phone]

## 2018-10-16 NOTE — Discharge Instructions (Signed)
Wear the sling most of the day, but remove it occasionally and rotate your shoulder and extend your elbow several times before reapplying.   If you are not getting better over the week, speak with your doctor or schedule an appointment with orthopedics.   Return to the ER for symptoms that change or worsen if unable to schedule an appointment.

## 2018-10-30 ENCOUNTER — Other Ambulatory Visit: Payer: Self-pay

## 2018-10-30 ENCOUNTER — Ambulatory Visit: Payer: Self-pay | Admitting: Licensed Clinical Social Worker

## 2018-10-30 DIAGNOSIS — F4310 Post-traumatic stress disorder, unspecified: Secondary | ICD-10-CM

## 2018-10-30 DIAGNOSIS — F4001 Agoraphobia with panic disorder: Secondary | ICD-10-CM

## 2018-10-30 DIAGNOSIS — F333 Major depressive disorder, recurrent, severe with psychotic symptoms: Secondary | ICD-10-CM

## 2018-10-30 NOTE — BH Specialist Note (Signed)
Integrated Behavioral Health Follow Up Phone Visit  MRN: [id number] Name: Brittney Chavez  Type of Service: [place] Interpretor:No. Interpretor Name and Language: Not applicable.  SUBJECTIVE: Brittney Chavez is a 58 y.o. female accompanied by phone. Patient was referred by Staci Acosta NP for mental health. Patient reports the following symptoms/concerns: She reports that she has yet to start the Zyprexa because she was placed on Prednisone and antibiotics so she wanted to finish them due to gaining ten pounds. She describes experiencing episodes of frustration and anxiety due to situational stressors with her family. She explains that she is trying to keep busy with cooking and cleaning but at the same time not put too much strain on her right arm. She notes that her spirits were lifted when she had dinner at her brother's house a week ago. She reports that she is utilizing her coping skills and trying to hold on to her sense of humor. She denies suicidal and homicidal thoughts.  Duration of problem: ; Severity of problem: moderate  OBJECTIVE: Mood: Euthymic and Affect: Appropriate Risk of harm to self or others: No plan to harm self or others  LIFE CONTEXT: Family and Social: see above. School/Work: Ms. Hey has applied for disability. Self-Care: see above. Life Changes: see above.  GOALS ADDRESSED: Patient will: 1.  Reduce symptoms of: anxiety  2.  Increase knowledge and/or ability of: coping skills, healthy habits, self-management skills and stress reduction  3.  Demonstrate ability to: Increase healthy adjustment to current life circumstances  INTERVENTIONS: Interventions utilized:  Brief CBT was utilized by the clinician focusing on the patient's anxiety and affect on normal cognition. Clinician processed with the patient regarding how she has been doing since the last follow up session. Clinician discussed with the patient  regarding the triggers for there recent symptoms of anxiety. Clinician explained to the patient that she can't control anyone else's thoughts, feelings, or behaviors but her own. Clinician suggested that the patient practice self care and focus on the things that are in her control versus what is out of her control. Clinician encouraged the patient to continue to utilize her coping skills.  Standardized Assessments completed: GAD-7 and PHQ 9  ASSESSMENT: Patient currently experiencing see above.   Patient may benefit from see above.  PLAN: 1. Follow up with behavioral health clinician on : one week or earlier if needed. 2. Behavioral recommendations: see above. 3. Referral(s): [place] (In Clinic) 4. "From scale of 1-10, how likely are you to follow plan?": [place], LCSW

## 2018-11-06 ENCOUNTER — Telehealth: Payer: Self-pay | Admitting: Licensed Clinical Social Worker

## 2018-11-06 ENCOUNTER — Ambulatory Visit: Payer: Self-pay | Admitting: Licensed Clinical Social Worker

## 2018-11-06 NOTE — Telephone Encounter (Signed)
Clinician contacted the patient twice at the time of the phone visit on 11/06/2018 @ 4 pm. She left a voicemail with call back information.

## 2018-11-11 ENCOUNTER — Telehealth: Payer: Self-pay | Admitting: Licensed Clinical Social Worker

## 2018-11-11 NOTE — Telephone Encounter (Signed)
Left voicemail with call back information about rescheduling her missed appointment from Thursday May 14th @ 4 pm.

## 2018-11-18 ENCOUNTER — Telehealth: Payer: Self-pay | Admitting: Licensed Clinical Social Worker

## 2018-11-18 NOTE — Telephone Encounter (Signed)
Clinician contacted the patient's daughter due to designated party release listed in chart because she has made numerous attempts to reach out to the patient and has been unsuccessful.   Her daughter informed her that her mom had a really bad fall and has been in a lot of pain. She explained that she would let her mom know that she has been trying to contact her and check in to see how she is doing.

## 2018-11-20 ENCOUNTER — Ambulatory Visit: Payer: Self-pay | Admitting: Licensed Clinical Social Worker

## 2018-11-20 ENCOUNTER — Other Ambulatory Visit: Payer: Self-pay

## 2018-11-20 DIAGNOSIS — F333 Major depressive disorder, recurrent, severe with psychotic symptoms: Secondary | ICD-10-CM

## 2018-11-20 DIAGNOSIS — F4001 Agoraphobia with panic disorder: Secondary | ICD-10-CM

## 2018-11-20 DIAGNOSIS — F4310 Post-traumatic stress disorder, unspecified: Secondary | ICD-10-CM

## 2018-11-20 NOTE — BH Specialist Note (Signed)
Integrated Behavioral Health Follow Up Visit Via Phone  MRN: [id number] Name: Brittney Chavez  Type of Service: [place] Interpretor:No. Interpretor Name and Language: Not applicable.  SUBJECTIVE: Brittney Chavez is a 58 y.o. female accompanied by herself. Patient was referred by Staci Acosta NP for mental health. Patient reports the following symptoms/concerns: She describes an increase in her anxiety due to her daughter moving on June 15th to start a new job in [place] for the next six months. She notes that she has been trying to stay busy and keep herself distracted to avoid thinking about her daughter leaving soon. She reports that her plan is to stay in [place] for now. She explains that her anxiety is high any time she has to leave the house because she doesn't have a washable face mask. She reports that she has yet to notice a difference in her symptoms of anxiety, depression, or mood swings since starting the Zyprexa five days ago. She denies suicidal and homicidal thoughts.  Duration of problem: ; Severity of problem: moderate  OBJECTIVE: Mood: Euthymic and Affect: Appropriate Risk of harm to self or others: No plan to harm self or others  LIFE CONTEXT: Family and Social: see above. School/Work: see above. Self-Care: see above. Life Changes: see above.  GOALS ADDRESSED: Patient will: 1.  Reduce symptoms of: anxiety  2.  Increase knowledge and/or ability of: coping skills and stress reduction  3.  Demonstrate ability to: Increase healthy adjustment to current life circumstances  INTERVENTIONS: Interventions utilized:  Brief CBT was utilized by the clinician focusing on the patient's anxiety and affect on normal cognition. Clinician processed with the patient regarding how she has been doing since the last follow up session. Clinician discussed with the patient the triggers contributing to her symptoms of anxiety. Clinician  encouraged the patient to continue to practice her coping skills and grounding techniques. Clinician asked the patient if she has noticed a difference in her symptoms of anxiety, depression, and mood swings since starting the Zyprexa 10 mg at bedtime. Clinician explained to the patient that it may take four to six weeks to kick in.  Standardized Assessments completed: GAD-7 and PHQ 9  ASSESSMENT: Patient currently experiencing see above .   Patient may benefit from see above.  PLAN: 1. Follow up with behavioral health clinician on : one week or earlier if needed. 2. Behavioral recommendations: see above. 3. Referral(s): [place] (In Clinic) 4. "From scale of 1-10, how likely are you to follow plan?": [place], LCSW

## 2018-11-25 ENCOUNTER — Other Ambulatory Visit: Payer: Self-pay | Admitting: Internal Medicine

## 2018-11-25 ENCOUNTER — Other Ambulatory Visit: Payer: Self-pay | Admitting: Adult Health Nurse Practitioner

## 2018-11-26 ENCOUNTER — Other Ambulatory Visit: Payer: Self-pay

## 2018-11-26 MED ORDER — LISINOPRIL 40 MG PO TABS: 40.0000 mg | ORAL_TABLET | Freq: Every day | ORAL | 1 refills | Status: AC

## 2018-11-27 ENCOUNTER — Ambulatory Visit: Payer: Self-pay | Admitting: Licensed Clinical Social Worker

## 2018-11-27 ENCOUNTER — Other Ambulatory Visit: Payer: Self-pay

## 2018-11-27 DIAGNOSIS — F333 Major depressive disorder, recurrent, severe with psychotic symptoms: Secondary | ICD-10-CM

## 2018-11-27 DIAGNOSIS — F4310 Post-traumatic stress disorder, unspecified: Secondary | ICD-10-CM

## 2018-11-27 DIAGNOSIS — F4001 Agoraphobia with panic disorder: Secondary | ICD-10-CM

## 2018-11-27 MED ORDER — GABAPENTIN 600 MG PO TABS: 600.0000 mg | ORAL_TABLET | Freq: Three times a day (TID) | ORAL | 0 refills | Status: AC

## 2018-11-27 NOTE — BH Specialist Note (Signed)
Integrated Behavioral Health Follow Up Visit Via Phone  MRN: [id number] Name: Brittney Chavez  Type of Service: [place] Interpretor:No. Interpretor Name and Language: Not applicable.  SUBJECTIVE: Brittney Chavez is a 58 y.o. female accompanied by herself. Patient was referred by Staci Acosta NP for mental health. Patient reports the following symptoms/concerns: She reports that she has bee having a difficult time this week because her oldest dog passed away in her arms on 10-02-2022. She reports that seeing the video of Angelena Sole being murdered by the police officer was very traumatic to watch and has triggered a lot of memories of her ex. She describes feeling anxious and scared to leave the house due to civil un rest. She notes that she hasn't been anywhere in awhile. She notes that she is trying to not think about her daughter moving to [place] in less than two weeks. She reports that she stopped taking the Zyprexa because she is getting close to 200 lbs due to binge eating. She notes that she is willing to try Lamictal as long as it doesn't affect her liver or cause weight gain. She denies suicidal and homicidal thoughts.  Duration of problem: ; Severity of problem: moderate  OBJECTIVE: Mood: Euthymic and Affect: Appropriate Risk of harm to self or others: No plan to harm self or others  LIFE CONTEXT: Family and Social: see above. School/Work: see above. Self-Care: see above. Life Changes: see above.  GOALS ADDRESSED: Patient will: 1.  Reduce symptoms of: anxiety  2.  Increase knowledge and/or ability of: coping skills, healthy habits and self-management skills  3.  Demonstrate ability to: Increase healthy adjustment to current life circumstances  INTERVENTIONS: Interventions utilized:  Brief CBT was utilized by the clinician focusing on the patient's symptoms of stress and affect on normal cognition. Clinician processed with the patient  regarding how she has been doing since the last follow up session. Clinician discussed with the patient the factors that are contributing to her symptoms of anxiety and stress. Clinician explained to the patient that in terms of medications, that Dr. Octavia Heir, MD, Psychiatric Consultant wanted to try her on Lamictal, which doesn't have any metabolic side effects. Clinician encouraged the patient to practice self care, utilize her coping skills, utilize her grounding techniques, and avoid watching the news because its only going to make her stress worse.  Standardized Assessments completed: GAD-7 and PHQ 9  ASSESSMENT: Patient currently experiencing see above.   Patient may benefit from see above .  PLAN: 1. Follow up with behavioral health clinician on : one week or earlier if needed. 2. Behavioral recommendations: Case consultation with Dr. Octavia Heir, MD, Psychiatric Consultant on Tuesday June 9th @ 9 am.  3. Referral(s): [place] (In Clinic) 4. "From scale of 1-10, how likely are you to follow plan?":   Bayard Hugger, LCSW

## 2018-12-04 ENCOUNTER — Other Ambulatory Visit: Payer: Self-pay

## 2018-12-04 ENCOUNTER — Ambulatory Visit: Payer: Self-pay | Admitting: Licensed Clinical Social Worker

## 2018-12-04 DIAGNOSIS — F333 Major depressive disorder, recurrent, severe with psychotic symptoms: Secondary | ICD-10-CM

## 2018-12-04 DIAGNOSIS — F4310 Post-traumatic stress disorder, unspecified: Secondary | ICD-10-CM

## 2018-12-04 DIAGNOSIS — F4001 Agoraphobia with panic disorder: Secondary | ICD-10-CM

## 2018-12-04 MED ORDER — LAMOTRIGINE 25 MG PO TABS: 25.0000 mg | ORAL_TABLET | Freq: Every day | ORAL | 0 refills | Status: AC

## 2018-12-04 NOTE — BH Specialist Note (Signed)
Integrated Behavioral Health Follow Up Visit Via Phone  MRN: [id number] Name: Brittney Chavez  Number of [place] Clinician visits: N/A. Session Start time: 10 am  Session End time: 10:50 am Total time: 50 minutes  Type of Service: [place] Interpretor:No. Interpretor Name and Language: Not applicable.   SUBJECTIVE: Brittney Chavez is a 58 y.o. female accompanied by herself. Patient was referred by Staci Acosta NP for mental health. Patient reports the following symptoms/concerns: She reports that she has been under a lot of stress and feeling emotional in the last week. She notes that she is having anxiety with her daughter moving, her brother, her health, and with everything that is happening in the world right now. She notes that she is feeling off balance ans ore. She notes that she has her disability hearing next Friday in the morning and is worried that she will not be able to focus. She denies suicidal and homicidal thoughts.  Duration of problem: ; Severity of problem: moderate  OBJECTIVE: Mood: Euthymic and Affect: Appropriate Risk of harm to self or others: No plan to harm self or others  LIFE CONTEXT: Family and Social: See above. School/Work: See above. Self-Care: See above. Life Changes: See above.  GOALS ADDRESSED: Patient will: 1.  Reduce symptoms of: anxiety and stress  2.  Increase knowledge and/or ability of: coping skills, healthy habits, self-management skills and stress reduction  3.  Demonstrate ability to: Increase healthy adjustment to current life circumstances  INTERVENTIONS: Interventions utilized:  Brief CBT was utilized by the clinician focusing on the patient's anxiety and affect on normal cognition. Clinician processed with the patient regarding how she has been doing since the last follow up session. Clinician discussed with the patient that factors that are contributing to her symptoms  of anxiety and stress. Clinician explained to the patient that it sounds like her body and brain are telling her to practice self care. Clinician explained that its important to try to not push herself into doing something that will cause stress on her body. Clinician explained to the patient that she had a case consultation with Dr. Octavia Heir, MD, psychiatric consultant who wants to discontinue the Zyprexa 10 mg at bedtime and start her on Lamictal 25 mg daily for two weeks and then increase it 50 mg daily to see how she does. Clinician explained that a rare side effect that can occur is a rash and advised the patient that if it happens to let her know immediately so the medication can be discontinued. Clinician explained to the patient that the Lamictal has less metabolic side effects but its important to take as directed to see the efficacy of the medication.  Standardized Assessments completed: GAD-7 and PHQ 9  ASSESSMENT: Patient currently experiencing see above .   Patient may benefit from see above.  PLAN: 1. Follow up with behavioral health clinician on : one week or earlier if needed. 2. Behavioral recommendations: Case consultation with Dr. Octavia Heir, MD, psychiatric consultant recommended that Zyprexa 10 mg PO QHS be discontinued due to the patient complaint of weight gain and not helping with her mental health symptoms. Start Lamictal 25 mg PO daily for two weeks and then increase to 50 mg PO daily to see how she does. Advise patient of rare side effect of a rash due to Lamictal, if this occurs to call her or the clinic to report it to discontinue the medication. Patient was agreeable to the plans. Risks,  benefits, and side effects were discussed with the patient.  3. Referral(s): [place] (In Clinic) 4. "From scale of 1-10, how likely are you to follow plan?":   Bayard Hugger, LCSW

## 2018-12-09 ENCOUNTER — Emergency Department
Admission: EM | Admit: 2018-12-09 | Discharge: 2018-12-09 | Disposition: A | Discharge status: Home / Self Care | Payer: Self-pay | Attending: Student in an Organized Health Care Education/Training Program | Admitting: Student in an Organized Health Care Education/Training Program

## 2018-12-09 ENCOUNTER — Encounter: Payer: Self-pay | Admitting: Emergency Medicine

## 2018-12-09 ENCOUNTER — Other Ambulatory Visit: Payer: Self-pay

## 2018-12-09 DIAGNOSIS — J449 Chronic obstructive pulmonary disease, unspecified: Secondary | ICD-10-CM | POA: Insufficient documentation

## 2018-12-09 DIAGNOSIS — Y9389 Activity, other specified: Secondary | ICD-10-CM | POA: Insufficient documentation

## 2018-12-09 DIAGNOSIS — S61412A Laceration without foreign body of left hand, initial encounter: Secondary | ICD-10-CM

## 2018-12-09 DIAGNOSIS — E039 Hypothyroidism, unspecified: Secondary | ICD-10-CM | POA: Insufficient documentation

## 2018-12-09 DIAGNOSIS — Y929 Unspecified place or not applicable: Secondary | ICD-10-CM | POA: Insufficient documentation

## 2018-12-09 DIAGNOSIS — S61012A Laceration without foreign body of left thumb without damage to nail, initial encounter: Secondary | ICD-10-CM | POA: Insufficient documentation

## 2018-12-09 DIAGNOSIS — Z79899 Other long term (current) drug therapy: Secondary | ICD-10-CM | POA: Insufficient documentation

## 2018-12-09 DIAGNOSIS — F17210 Nicotine dependence, cigarettes, uncomplicated: Secondary | ICD-10-CM | POA: Insufficient documentation

## 2018-12-09 DIAGNOSIS — Y998 Other external cause status: Secondary | ICD-10-CM | POA: Insufficient documentation

## 2018-12-09 DIAGNOSIS — W25XXXA Contact with sharp glass, initial encounter: Secondary | ICD-10-CM | POA: Insufficient documentation

## 2018-12-09 DIAGNOSIS — S61411A Laceration without foreign body of right hand, initial encounter: Secondary | ICD-10-CM | POA: Insufficient documentation

## 2018-12-09 DIAGNOSIS — I10 Essential (primary) hypertension: Secondary | ICD-10-CM | POA: Insufficient documentation

## 2018-12-09 MED ORDER — OXYCODONE-ACETAMINOPHEN 5-325 MG PO TABS
1.0000 | ORAL_TABLET | Freq: Once | ORAL | Status: AC
  Administered 2018-12-09: 12:00:00 1 via ORAL
  Filled 2018-12-09: qty 1

## 2018-12-09 MED ORDER — OXYCODONE-ACETAMINOPHEN 7.5-325 MG PO TABS: 1.0000 | ORAL_TABLET | Freq: Four times a day (QID) | ORAL | 0 refills | Status: AC | PRN

## 2018-12-09 MED ORDER — LIDOCAINE HCL (PF) 1 % IJ SOLN
10.0000 mL | Freq: Once | INTRAMUSCULAR | Status: AC
  Administered 2018-12-09: 10 mL

## 2018-12-09 NOTE — ED Provider Notes (Signed)
[facility] Emergency Department Provider Note   ____________________________________________   First MD Initiated Contact with Patient 12/09/18 1200     (approximate)  I have reviewed the triage vital signs and the nursing notes.   HISTORY  Chief Complaint Laceration    HPI Brittney Chavez is a 58 y.o. female presents with bilateral hand laceration secondary to a glass cut.  Patient states she was moving heavy glass table when she is tripped and fell causing a table to break and lacerating her bilateral hand.  Patient state bleeding is controlled direct pressure.  Patient denies loss sensation or loss of function.  Patient rates the pain as a 7/10.  Patient described the pain is "achy".  No other palliative measure for complaint.         Past Medical History:  Diagnosis Date  . Abnormal Pap smear of cervix   . Anxiety   . Arthritis   . Barrett esophagus   . 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 Colon polyp    unc  . COPD (chronic obstructive pulmonary disease) ([place])   . DDD (degenerative disc disease), lumbar   . GERD (gastroesophageal reflux disease)   . Hepatitis C   . Hepatitis C   . Hypertension   . Hypothyroidism   . Neuropathy   . PTSD (post-traumatic stress disorder)   . Scoliosis   . Thyroid disease   . Torn ACL (anterior cruciate ligament)    Left Knee    Patient Active Problem List   Diagnosis Date Noted  . Epigastric pain 06/12/2018  . Chest pain 06/12/2018  . Elevated glucose 03/13/2018  . Leukocytosis 03/13/2018  . Neuropathy 02/06/2018  . Hypertriglyceridemia 01/09/2018  . Essential hypertension, benign 01/09/2018  . Vaginal atrophy 08/29/2017  . Dyspareunia in female 08/29/2017  . Hypothyroidism 07/23/2017  . COPD with acute exacerbation ([place]) 07/13/2017  . 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 Dysplasia of cervix 05/02/2016  . 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 Tobacco  user 04/17/2016  . Vulvar lesion 04/17/2016  . Scoliosis 03/21/2016  . History of underactive thyroid 03/21/2016  . Dysphagia 03/21/2016    Past Surgical History:  Procedure Laterality Date  . CERVICAL BIOPSY  W/ LOOP ELECTRODE EXCISION    . CERVICAL CONE BIOPSY    . CERVIX LESION DESTRUCTION    . DILATION AND CURETTAGE OF UTERUS     x 2  . LEEP N/A 06/04/2016   Procedure: LOOP ELECTROSURGICAL EXCISION PROCEDURE (LEEP);  Surgeon: Brayton Mars, MD;  Location: [facility] ORS;  Service: Gynecology;  Laterality: N/A;  . NECK SURGERY     Cervical Fusion  . THYROIDECTOMY, PARTIAL Bilateral   . TONSILLECTOMY     as a child  . TUBAL LIGATION      Prior to Admission medications   Medication Sig Start Date End Date Taking? Authorizing Provider  acetaminophen (TYLENOL) 500 MG tablet Take 500 mg by mouth every 6 (six) hours as needed for mild pain.     [provider]  albuterol (PROAIR HFA) 108 (90 Base) MCG/ACT inhaler Inhale 2 puffs into the lungs every 6 (six) hours as needed for wheezing or shortness of breath. 10/02/18   McGowan, Larene Beach A, PA-C  atorvastatin (LIPITOR) 40 MG tablet TAKE ONE TABLET BY MOUTH EVERY DAY 09/11/18   Doles-Johnson, Teah, NP  cetirizine (ZYRTEC) 10 MG tablet Take 1 tablet (10 mg total) by mouth daily. 03/26/17   McGowan,  Larene Beach A, PA-C  conjugated estrogens (PREMARIN) vaginal cream 1-2 gram intravaginal 2 times a week 08/29/17   Defrancesco, Alanda Slim, MD  conjugated estrogens (PREMARIN) vaginal cream 1 gram intravaginal BIW 10/09/18   Harlin Heys, MD  fexofenadine-pseudoephedrine (ALLEGRA-D) 60-120 MG 12 hr tablet Take 1 tablet by mouth 2 (two) times daily. 09/08/18   Sable Feil, PA-C  gabapentin (NEURONTIN) 600 MG tablet Take 1 tablet (600 mg total) by mouth 3 (three) times daily. 11/27/18   Doles-Johnson, Teah, NP  HYDROcodone-acetaminophen (NORCO/VICODIN) 5-325 MG tablet Take 1 tablet by mouth every 4 (four) hours as needed for moderate pain.  10/16/18 10/16/19  Triplett, Johnette Abraham B, FNP  lamoTRIgine (LAMICTAL) 25 MG tablet Take 1 tablet (25 mg total) by mouth daily. Take 25mg  daily for 2 weeks. Then increase to 50mg  daily for 2 weeks. 12/04/18   Doles-Johnson, Teah, NP  levothyroxine (SYNTHROID) 100 MCG tablet TAKE ONE TABLET BY MOUTH EVERY DAY BEFORE BREAKFAST 11/25/18   Iloabachie, Chioma E, NP  lisinopril (ZESTRIL) 40 MG tablet Take 1 tablet (40 mg total) by mouth daily. 11/26/18   Doles-Johnson, Teah, NP  ondansetron (ZOFRAN) 4 MG tablet Take 1 tablet (4 mg total) by mouth every 8 (eight) hours as needed for nausea or vomiting. 06/12/18   Doles-Johnson, Teah, NP  oxyCODONE-acetaminophen (PERCOCET) 7.5-325 MG tablet Take 1 tablet by mouth every 6 (six) hours as needed. 12/09/18   Sable Feil, PA-C  pantoprazole (PROTONIX) 40 MG tablet Take 1 tablet (40 mg total) by mouth 2 (two) times daily for 14 days. 09/25/18 10/09/18  McGowan, Larene Beach A, PA-C  PROAIR HFA 108 (90 Base) MCG/ACT inhaler INHALE 2 PUFFS INTO THE LUNGS EVERY 6 HOURS AS NEEDED FOR WHEEZING ORSHORTNESS OF BREATH 04/30/18   Tawni Millers, MD  tiotropium (SPIRIVA) 18 MCG inhalation capsule Place 1 capsule (18 mcg total) into inhaler and inhale daily. 09/02/18   Doles-Johnson, Teah, NP  valACYclovir (VALTREX) 1000 MG tablet Take 1 tablet (1,000 mg total) by mouth daily as needed. FLARE UPS 03/26/17   Zara Council A, PA-C    Allergies Bee pollen, Gabapentin, Nsaids, and Pollen extract  Family History  Problem Relation Age of Onset  . Cancer Mother   . Diabetes Mother   . COPD Mother   . Breast cancer Mother 35  . COPD Father   . Stroke Father   . Polycystic ovary syndrome Daughter   . Birth defects Maternal Grandmother   . Colon cancer Maternal Grandmother   . Breast cancer Maternal Grandmother   . Birth defects Paternal Grandmother   . Breast cancer Paternal Grandmother   . Ovarian cancer Neg Hx   . Heart disease Neg Hx     Social History Social History   Tobacco  Use  . Smoking status: Current Every Day Smoker    Packs/day: 0.25    Years: 30.00    Pack years: 7.50    Types: Cigarettes  . Smokeless tobacco: Never Used  Substance Use Topics  . Alcohol use: No  . Drug use: Yes    Types: Marijuana    Comment: 2 x a week    Review of Systems Constitutional: No fever/chills Eyes: No visual changes. ENT: No sore throat. Cardiovascular: Denies chest pain. Respiratory: Denies shortness of breath. Gastrointestinal: No abdominal pain.  No nausea, no vomiting.  No diarrhea.  No constipation. Genitourinary: Negative for dysuria. Musculoskeletal: Negative for back pain. Skin: Negative for rash.  Bilateral hand laceration. Neurological: Negative for headaches,  focal weakness or numbness. Psychiatric: Anxiety and PTSD.  Endocrine:  Hypertension hypothyroidism Allergic/Immunilogical: Medication list. ____________________________________________   PHYSICAL EXAM:  VITAL SIGNS: ED Triage Vitals  Enc Vitals Group     BP 12/09/18 1159 (!) 154/67     Pulse Rate 12/09/18 1159 88     Resp 12/09/18 1159 18     Temp 12/09/18 1159 97.9 F (36.6 C)     Temp Source 12/09/18 1159 Oral     SpO2 12/09/18 1159 97 %     Weight 12/09/18 1200 185 lb (83.9 kg)     Height 12/09/18 1200 5\' 4"  (1.626 m)     Head Circumference --      Peak Flow --      Pain Score 12/09/18 1159 7     Pain Loc --      Pain Edu? --      Excl. in [place]? --    Constitutional: Alert and oriented.  Anxious.   Cardiovascular: Normal rate, regular rhythm. Grossly normal heart sounds.  Good peripheral circulation. Respiratory: Normal respiratory effort.  No retractions. Lungs CTAB. Gastrointestinal: Soft and nontender. No distention. No abdominal bruits. No CVA tenderness. Musculoskeletal: No lower extremity tenderness nor edema.  No joint effusions. Neurologic:  Normal speech and language. No gross focal neurologic deficits are appreciated. No gait instability. Skin:  Skin is warm, dry  and intact. No rash noted. Psychiatric: Mood and affect are normal. Speech and behavior are normal.  ____________________________________________   LABS (all labs ordered are listed, but only abnormal results are displayed)  Labs Reviewed - No data to display ____________________________________________  EKG   ____________________________________________  RADIOLOGY  ED MD interpretation:    Official radiology report(s): No results found.  ____________________________________________   PROCEDURES  Procedure(s) performed (including Critical Care):  Marland KitchenMarland KitchenLaceration Repair  Date/Time: 12/09/2018 1:17 PM Performed by: Sable Feil, PA-C Authorized by: Sable Feil, PA-C   Consent:    Consent obtained:  Verbal   Consent given by:  Patient   Risks discussed:  Infection, pain, poor cosmetic result and retained foreign body Anesthesia (see MAR for exact dosages):    Anesthesia method:  Local infiltration   Local anesthetic:  Lidocaine 1% w/o epi Laceration details:    Location:  Hand   Hand location:  R palm   Length (cm):  3.5 Repair type:    Repair type:  Simple Exploration:    Hemostasis achieved with:  Direct pressure   Wound exploration: wound explored through full range of motion     Contaminated: no   Treatment:    Area cleansed with:  Betadine and saline   Amount of cleaning:  Extensive   Irrigation solution:  Sterile saline Skin repair:    Repair method:  Sutures   Suture size:  3-0   Suture material:  Nylon   Suture technique:  Simple interrupted   Number of sutures:  13 Approximation:    Approximation:  Close Post-procedure details:    Dressing:  Bulky dressing and sterile dressing   Patient tolerance of procedure:  Tolerated well, no immediate complications  .Marland KitchenLaceration Repair  Date/Time: 12/09/2018 1:21 PM Performed by: Sable Feil, PA-C Authorized by: Sable Feil, PA-C   Consent:    Consent obtained:  Verbal   Consent given  by:  Patient   Risks discussed:  Infection, pain, retained foreign body and poor cosmetic result Anesthesia (see MAR for exact dosages):    Anesthesia method:  Local infiltration  Local anesthetic:  Lidocaine 1% w/o epi Laceration details:    Location:  Finger   Finger location:  L thumb   Length (cm):  1 Repair type:    Repair type:  Simple Pre-procedure details:    Preparation:  Patient was prepped and draped in usual sterile fashion Exploration:    Wound exploration: wound explored through full range of motion     Contaminated: no   Treatment:    Area cleansed with:  Betadine and saline Skin repair:    Repair method:  Sutures   Suture size:  3-0   Suture material:  Nylon   Suture technique:  Simple interrupted   Number of sutures:  2 Approximation:    Approximation:  Close Post-procedure details:    Dressing:  Sterile dressing   Patient tolerance of procedure:  Tolerated well, no immediate complications     ____________________________________________   INITIAL IMPRESSION / ASSESSMENT AND PLAN / ED COURSE  As part of my medical decision making, I reviewed the following data within the [place]         Patient presents with bilateral hand laceration secondary to a glass cut.  Patient is neurovascular intact free and equal range of motion.  Discussed patient rationale for suturing wounds.  Patient tolerated procedure well with given discharge care instruction on medication for pain.  Patient advised return back in 7 to 10 days for suture removal.      ____________________________________________   FINAL CLINICAL IMPRESSION(S) / ED DIAGNOSES  Final diagnoses:  Laceration of right hand without foreign body, initial encounter  Laceration of left hand without foreign body, initial encounter     ED Discharge Orders         Ordered    oxyCODONE-acetaminophen (PERCOCET) 7.5-325 MG tablet  Every 6 hours PRN     12/09/18 1314            Note:  This document was prepared using Dragon voice recognition software and may include unintentional dictation errors.    Sable Feil, PA-C 12/09/18 1329    Merlyn Lot, MD 12/09/18 1447

## 2018-12-09 NOTE — ED Triage Notes (Signed)
States she lost her balance  Fell into a glass coffee table  Lacerations noted to both hands

## 2018-12-09 NOTE — ED Notes (Signed)
Band-aid applied to L hand laceration after cleansing. R hand cleansed, covered with telfa, 4x4 gauze, and secured with gauze wrap.

## 2018-12-09 NOTE — Discharge Instructions (Addendum)
Follow discharge care instruction return back for suture removal.

## 2018-12-09 NOTE — ED Notes (Signed)

## 2018-12-11 ENCOUNTER — Ambulatory Visit: Payer: Self-pay | Admitting: Licensed Clinical Social Worker

## 2018-12-11 ENCOUNTER — Telehealth: Payer: Self-pay | Admitting: Licensed Clinical Social Worker

## 2018-12-11 NOTE — Telephone Encounter (Signed)
CMA Leda Min attempted to contact patient at time of appointment due to clinician emergency.  Clinician contacted the patient around 20 after and left voicemail with call back information and saw that patient was in [facility] ER on 6/16 for laceration.

## 2018-12-15 ENCOUNTER — Telehealth: Payer: Self-pay | Admitting: Pharmacist

## 2018-12-15 NOTE — Telephone Encounter (Signed)
12/15/2018 8:22:35 AM - ProAir refill  12/15/2018 Faxed [facility] refill request for ProAir.Delos Haring

## 2018-12-16 ENCOUNTER — Emergency Department
Admission: EM | Admit: 2018-12-16 | Discharge: 2018-12-16 | Disposition: A | Discharge status: Home / Self Care | Payer: Self-pay | Attending: Emergency Medicine | Admitting: Emergency Medicine

## 2018-12-16 ENCOUNTER — Other Ambulatory Visit: Payer: Self-pay

## 2018-12-16 ENCOUNTER — Encounter: Payer: Self-pay | Admitting: Emergency Medicine

## 2018-12-16 DIAGNOSIS — F1210 Cannabis abuse, uncomplicated: Secondary | ICD-10-CM | POA: Insufficient documentation

## 2018-12-16 DIAGNOSIS — W268XXD Contact with other sharp object(s), not elsewhere classified, subsequent encounter: Secondary | ICD-10-CM | POA: Insufficient documentation

## 2018-12-16 DIAGNOSIS — F17210 Nicotine dependence, cigarettes, uncomplicated: Secondary | ICD-10-CM | POA: Insufficient documentation

## 2018-12-16 DIAGNOSIS — Z79899 Other long term (current) drug therapy: Secondary | ICD-10-CM | POA: Insufficient documentation

## 2018-12-16 DIAGNOSIS — J449 Chronic obstructive pulmonary disease, unspecified: Secondary | ICD-10-CM | POA: Insufficient documentation

## 2018-12-16 DIAGNOSIS — S61412D Laceration without foreign body of left hand, subsequent encounter: Secondary | ICD-10-CM | POA: Insufficient documentation

## 2018-12-16 DIAGNOSIS — E039 Hypothyroidism, unspecified: Secondary | ICD-10-CM | POA: Insufficient documentation

## 2018-12-16 DIAGNOSIS — S61411D Laceration without foreign body of right hand, subsequent encounter: Secondary | ICD-10-CM | POA: Insufficient documentation

## 2018-12-16 DIAGNOSIS — I10 Essential (primary) hypertension: Secondary | ICD-10-CM | POA: Insufficient documentation

## 2018-12-16 DIAGNOSIS — Z5189 Encounter for other specified aftercare: Secondary | ICD-10-CM

## 2018-12-16 MED ORDER — LIDOCAINE-EPINEPHRINE-TETRACAINE (LET) SOLUTION
3.0000 mL | Freq: Once | NASAL | Status: AC
  Administered 2018-12-16: 3 mL via TOPICAL
  Filled 2018-12-16: qty 3

## 2018-12-16 NOTE — ED Provider Notes (Signed)
[facility] Emergency Department Provider Note  ____________________________________________   First MD Initiated Contact with Patient 12/16/18 1240     (approximate)  I have reviewed the triage vital signs and the nursing notes.   HISTORY  Chief Complaint Suture / Staple Removal   HPI Brittney Chavez is a 58 y.o. female presents to the ED for suture removal to lacerations that were taken care of 7 days ago in the ED.  Patient has lacerations to bilateral palms.  Initially she did not have any complaints.      Past Medical History:  Diagnosis Date  . Abnormal Pap smear of cervix   . Anxiety   . Arthritis   . Barrett esophagus   . 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 Colon polyp    unc  . COPD (chronic obstructive pulmonary disease) ([place])   . DDD (degenerative disc disease), lumbar   . GERD (gastroesophageal reflux disease)   . Hepatitis C   . Hepatitis C   . Hypertension   . Hypothyroidism   . Neuropathy   . PTSD (post-traumatic stress disorder)   . Scoliosis   . Thyroid disease   . Torn ACL (anterior cruciate ligament)    Left Knee    Patient Active Problem List   Diagnosis Date Noted  . Epigastric pain 06/12/2018  . Chest pain 06/12/2018  . Elevated glucose 03/13/2018  . Leukocytosis 03/13/2018  . Neuropathy 02/06/2018  . Hypertriglyceridemia 01/09/2018  . Essential hypertension, benign 01/09/2018  . Vaginal atrophy 08/29/2017  . Dyspareunia in female 08/29/2017  . Hypothyroidism 07/23/2017  . COPD with acute exacerbation ([place]) 07/13/2017  . 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 Dysplasia of cervix 05/02/2016  . 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 Tobacco user 04/17/2016  . Vulvar lesion 04/17/2016  . Scoliosis 03/21/2016  . History of underactive thyroid 03/21/2016  . Dysphagia 03/21/2016    Past Surgical History:  Procedure Laterality Date  . CERVICAL BIOPSY  W/ LOOP ELECTRODE  EXCISION    . CERVICAL CONE BIOPSY    . CERVIX LESION DESTRUCTION    . DILATION AND CURETTAGE OF UTERUS     x 2  . LEEP N/A 06/04/2016   Procedure: LOOP ELECTROSURGICAL EXCISION PROCEDURE (LEEP);  Surgeon: Brayton Mars, MD;  Location: [facility] ORS;  Service: Gynecology;  Laterality: N/A;  . NECK SURGERY     Cervical Fusion  . THYROIDECTOMY, PARTIAL Bilateral   . TONSILLECTOMY     as a child  . TUBAL LIGATION      Prior to Admission medications   Medication Sig Start Date End Date Taking? Authorizing Provider  acetaminophen (TYLENOL) 500 MG tablet Take 500 mg by mouth every 6 (six) hours as needed for mild pain.     [provider]  albuterol (PROAIR HFA) 108 (90 Base) MCG/ACT inhaler Inhale 2 puffs into the lungs every 6 (six) hours as needed for wheezing or shortness of breath. 10/02/18   McGowan, Larene Beach A, PA-C  atorvastatin (LIPITOR) 40 MG tablet TAKE ONE TABLET BY MOUTH EVERY DAY 09/11/18   Doles-Johnson, Teah, NP  cetirizine (ZYRTEC) 10 MG tablet Take 1 tablet (10 mg total) by mouth daily. 03/26/17   Zara Council A, PA-C  conjugated estrogens (PREMARIN) vaginal cream 1-2 gram intravaginal 2 times a week 08/29/17   Defrancesco, Alanda Slim, MD  conjugated estrogens (PREMARIN) vaginal cream 1 gram intravaginal BIW 10/09/18   Harlin Heys, MD  fexofenadine-pseudoephedrine (ALLEGRA-D) 60-120 MG 12 hr tablet Take 1 tablet by mouth 2 (two) times daily. 09/08/18   Sable Feil, PA-C  gabapentin (NEURONTIN) 600 MG tablet Take 1 tablet (600 mg total) by mouth 3 (three) times daily. 11/27/18   Doles-Johnson, Teah, NP  lamoTRIgine (LAMICTAL) 25 MG tablet Take 1 tablet (25 mg total) by mouth daily. Take 25mg  daily for 2 weeks. Then increase to 50mg  daily for 2 weeks. 12/04/18   Doles-Johnson, Teah, NP  levothyroxine (SYNTHROID) 100 MCG tablet TAKE ONE TABLET BY MOUTH EVERY DAY BEFORE BREAKFAST 11/25/18   Iloabachie, Chioma E, NP  lisinopril (ZESTRIL) 40 MG tablet Take 1 tablet (40 mg  total) by mouth daily. 11/26/18   Doles-Johnson, Teah, NP  ondansetron (ZOFRAN) 4 MG tablet Take 1 tablet (4 mg total) by mouth every 8 (eight) hours as needed for nausea or vomiting. 06/12/18   Doles-Johnson, Teah, NP  oxyCODONE-acetaminophen (PERCOCET) 7.5-325 MG tablet Take 1 tablet by mouth every 6 (six) hours as needed. 12/09/18   Sable Feil, PA-C  pantoprazole (PROTONIX) 40 MG tablet Take 1 tablet (40 mg total) by mouth 2 (two) times daily for 14 days. 09/25/18 10/09/18  McGowan, Larene Beach A, PA-C  PROAIR HFA 108 (90 Base) MCG/ACT inhaler INHALE 2 PUFFS INTO THE LUNGS EVERY 6 HOURS AS NEEDED FOR WHEEZING ORSHORTNESS OF BREATH 04/30/18   Tawni Millers, MD  tiotropium (SPIRIVA) 18 MCG inhalation capsule Place 1 capsule (18 mcg total) into inhaler and inhale daily. 09/02/18   Doles-Johnson, Teah, NP  valACYclovir (VALTREX) 1000 MG tablet Take 1 tablet (1,000 mg total) by mouth daily as needed. FLARE UPS 03/26/17   Zara Council A, PA-C    Allergies Bee pollen, Gabapentin, Nsaids, and Pollen extract  Family History  Problem Relation Age of Onset  . Cancer Mother   . Diabetes Mother   . COPD Mother   . Breast cancer Mother 35  . COPD Father   . Stroke Father   . Polycystic ovary syndrome Daughter   . Birth defects Maternal Grandmother   . Colon cancer Maternal Grandmother   . Breast cancer Maternal Grandmother   . Birth defects Paternal Grandmother   . Breast cancer Paternal Grandmother   . Ovarian cancer Neg Hx   . Heart disease Neg Hx     Social History Social History   Tobacco Use  . Smoking status: Current Every Day Smoker    Packs/day: 0.25    Years: 30.00    Pack years: 7.50    Types: Cigarettes  . Smokeless tobacco: Never Used  Substance Use Topics  . Alcohol use: No  . Drug use: Yes    Types: Marijuana    Comment: 2 x a week    Review of Systems Constitutional: No fever/chills Cardiovascular: Denies chest pain. Respiratory: Denies shortness of breath.  Musculoskeletal: Negative for back pain. Skin: Healing laceration bilateral hands. Neurological: Negative for headaches, focal weakness or numbness. ____________________________________________   PHYSICAL EXAM:  VITAL SIGNS: ED Triage Vitals [12/16/18 1221]  Enc Vitals Group     BP (!) 121/95     Pulse Rate 74     Resp 20     Temp 97.7 F (36.5 C)     Temp Source Oral     SpO2 97 %     Weight 185 lb (83.9 kg)     Height 5\' 4"  (1.626 m)     Head Circumference      Peak Flow  Pain Score 3     Pain Loc      Pain Edu?      Excl. in [place]?     Constitutional: Alert and oriented. Well appearing and in no acute distress. Eyes: Conjunctivae are normal.  Head: Atraumatic. Neck: No stridor.   Musculoskeletal: Bilateral hands with lacerations on the volar aspect without evidence of infection.  Patient is able to move all digits without any difficulty. Neurologic:  Normal speech and language. No gross focal neurologic deficits are appreciated. No gait instability. Skin:  Skin is warm, dry. Psychiatric: Mood and affect are normal. Speech and behavior are normal.  ____________________________________________   LABS (all labs ordered are listed, but only abnormal results are displayed)  Labs Reviewed - No data to display  PROCEDURES  Procedure(s) performed (including Critical Care):  Procedures   ____________________________________________   INITIAL IMPRESSION / ASSESSMENT AND PLAN / ED COURSE  As part of my medical decision making, I reviewed the following data within the electronic MEDICAL RECORD NUMBER Notes from prior ED visits and [place] Controlled Substance Database  58 year old female presents to the ED for suture removal.  Patient was seen 7 days ago which time she had lacerations to bilateral hands on the volar aspect.  She denies any drainage, fever, redness.  Patient had little tolerance for suture removal.  LET was placed on the suture site.  A 1 cm area after suture  removal appears to be dehisced and the remaining sutures were left to stay for the next 5 days.  At that time patient will return for suture removal. ____________________________________________   FINAL CLINICAL IMPRESSION(S) / ED DIAGNOSES  Final diagnoses:  Visit for wound check     ED Discharge Orders    None       Note:  This document was prepared using Dragon voice recognition software and may include unintentional dictation errors.    Johnn Hai, PA-C 12/16/18 1421    Lavonia Drafts, MD 12/16/18 [phone]

## 2018-12-16 NOTE — ED Triage Notes (Signed)
Pt here to have 12 stitches removed from right hand a 2-3 from left hand. Denies concerns for infection.

## 2018-12-16 NOTE — ED Notes (Addendum)
2 sutures removed for left hand   Tolerated well  3 sutures removed from right hand   Small area di hissed    Steri strips applied    Instructed to have remaining sutures removed in 5 days

## 2018-12-16 NOTE — ED Notes (Signed)
See triage note  States she is here for suture removal  Sutures intact

## 2018-12-16 NOTE — Discharge Instructions (Signed)
Return to the ED in 5 days for suture removal.  Continue to keep the area clean and dry.

## 2018-12-30 ENCOUNTER — Telehealth: Payer: Self-pay | Admitting: Licensed Clinical Social Worker

## 2018-12-30 NOTE — Telephone Encounter (Signed)
Clinician reached out to the patient to check in and see how she was doing. She left a voicemail with call back information.

## 2019-02-16 ENCOUNTER — Other Ambulatory Visit: Payer: Self-pay | Admitting: Gerontology

## 2019-04-15 ENCOUNTER — Telehealth: Payer: Self-pay

## 2019-04-15 NOTE — Telephone Encounter (Signed)
Received medical record request for patient for a [facility] practice, provider Teofilo Pod, DO based in [place].  Fax number to send records to cuts off last digit.  Believe it is a "4", however need to confirm.  Attempted to reach patient for fax number or number for new medical practice, however unable to reach.  Voicemail left encouraging callback.

## 2019-06-16 ENCOUNTER — Telehealth: Payer: Self-pay | Admitting: Pharmacy Technician

## 2019-06-16 NOTE — Telephone Encounter (Signed)
Patient has prescription drug coverage.  No longer meets the eligibility criteria to receive medication assistance at [facility].  Patient notified.  [facility]

## 2019-11-25 ENCOUNTER — Ambulatory Visit: Payer: Self-pay
